# Patient Record
Sex: Male | Born: 1980 | Race: Black or African American | Hispanic: No | Marital: Married | Smoking: Never smoker
Health system: Southern US, Community
[De-identification: ages and names within clinical notes are randomized; demographics above are authoritative.]

## PROBLEM LIST (undated history)

## (undated) HISTORY — PX: WISDOM TOOTH EXTRACTION: SHX21

---

## 2006-07-16 ENCOUNTER — Ambulatory Visit: Payer: Self-pay | Admitting: Internal Medicine

## 2006-07-16 LAB — CONVERTED CEMR LAB
ALT: 38 units/L (ref 0–40)
Alkaline Phosphatase: 79 units/L (ref 39–117)
Chloride: 103 meq/L (ref 96–112)
Glucose, Bld: 91 mg/dL (ref 70–99)
LDL Cholesterol: 92 mg/dL (ref 0–99)
Potassium: 4.1 meq/L (ref 3.5–5.1)
Sodium: 139 meq/L (ref 135–145)
Total Protein: 7.3 g/dL (ref 6.0–8.3)
VLDL: 12 mg/dL (ref 0–40)

## 2006-08-25 ENCOUNTER — Emergency Department (HOSPITAL_COMMUNITY): Admission: EM | Admit: 2006-08-25 | Discharge: 2006-08-25 | Payer: Self-pay | Admitting: Emergency Medicine

## 2007-02-13 ENCOUNTER — Encounter: Payer: Self-pay | Admitting: Internal Medicine

## 2007-09-24 ENCOUNTER — Ambulatory Visit: Payer: Self-pay | Admitting: Internal Medicine

## 2007-09-24 DIAGNOSIS — M6740 Ganglion, unspecified site: Secondary | ICD-10-CM | POA: Insufficient documentation

## 2007-12-17 ENCOUNTER — Ambulatory Visit: Payer: Self-pay | Admitting: Internal Medicine

## 2007-12-17 DIAGNOSIS — L732 Hidradenitis suppurativa: Secondary | ICD-10-CM

## 2008-11-17 ENCOUNTER — Ambulatory Visit: Payer: Self-pay | Admitting: Internal Medicine

## 2008-12-19 ENCOUNTER — Telehealth: Payer: Self-pay | Admitting: Internal Medicine

## 2009-12-05 ENCOUNTER — Telehealth: Payer: Self-pay | Admitting: Internal Medicine

## 2010-03-01 ENCOUNTER — Ambulatory Visit: Payer: Self-pay | Admitting: Internal Medicine

## 2010-03-04 ENCOUNTER — Ambulatory Visit: Payer: Self-pay | Admitting: Internal Medicine

## 2010-03-05 ENCOUNTER — Encounter (INDEPENDENT_AMBULATORY_CARE_PROVIDER_SITE_OTHER): Payer: Self-pay | Admitting: *Deleted

## 2010-03-05 LAB — CONVERTED CEMR LAB
Chloride: 104 meq/L (ref 96–112)
Creatinine, Ser: 1 mg/dL (ref 0.4–1.5)
LDL Cholesterol: 83 mg/dL (ref 0–99)
Total CHOL/HDL Ratio: 2
Triglycerides: 63 mg/dL (ref 0.0–149.0)

## 2010-03-09 ENCOUNTER — Encounter: Payer: Self-pay | Admitting: Internal Medicine

## 2010-03-29 ENCOUNTER — Ambulatory Visit: Payer: Self-pay | Admitting: Internal Medicine

## 2010-03-29 DIAGNOSIS — J020 Streptococcal pharyngitis: Secondary | ICD-10-CM | POA: Insufficient documentation
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
## 2010-05-14 NOTE — Progress Notes (Signed)
      Immunization History:  Influenza Immunization History:    Influenza:  historical (11/22/2009)

## 2010-05-14 NOTE — Letter (Signed)
[facility] [address] [place], [place]  [address] Phone: [phone]      March 10, 2010   Mason Maddox [address] [place], [place] [address]  RE:  LAB RESULTS  Dear  Mason Maddox,  The following is an interpretation of your most recent lab tests.  Please take note of any instructions provided or changes to medications that have resulted from your lab work.  ELECTROLYTES:  Good - no changes needed  KIDNEY FUNCTION TESTS:  Good - no changes needed  Health professionals look at cholesterol as more involved than just the total cholesterol. We consider the level of LDL (bad) cholesterol, HDL (good), cholesterol, and Triglycerides (Grease) in the blood.  1. Your LDL should be under 100, and the HDL should be over 45, if you have any vascular disease such as heart attack, angina, stroke, TIA (mini stroke), claudication (pain in the legs when you walk due to poor circulation),  Abdominal Aortic Aneurysm (AAA), diabetes or prediabetes.  2. Your LDL should be under 130 if you have any two of the following:     a. Smoke or chew tobacco,     b. High blood pressure (if you are on medication or over 140/90 without medication),     c. Male gender,    d. HDL below 40,    e. A male relative (father, brother, or son), who have had any vascular event          as described in #1. above under the age of 28, or a male relative (mother,       sister, or daughter) who had an event as described above under age 56. (An HDL over 60 will subtract one risk factor from the total, so if you have two items in # 2 above, but an HDL over 60, you then fall into category # 3 below).  3. Your LDL should be under 160 if you have any one of the above.  Triglycerides should be under 200 with the ideal being under 150.  For diabetes or pre-diabetes, the ideal HgbA1C should be under 6.0%.  If you fall into any of the above categories, you should make a follow up appointment to  discuss this with your physician.  LIPID PANEL:  Good - no changes needed Triglyceride: 63.0   Cholesterol: 191   LDL: 83   HDL: 95.70   Chol/HDL%:  2  DIABETIC STUDIES:  Good - no changes needed Blood Glucose: 123    Lab results look fine except for the fasting blood sugar being 123 with normal being 70-115.  Please follow a sugar restricted, low carb diet.  Please come see me if you have any questions about these lab results.   Sincerely Yours,    Jacques Navy MD  Jolinda Croak Note: All result statuses are Final unless otherwise noted.  Tests: (1) Lipid Panel (LIPID)   Cholesterol               191 mg/dL                   0-102     ATP III Classification            Desirable:  < 200 mg/dL                    Borderline High:  200 - 239 mg/dL  High:  > = 240 mg/dL   Triglycerides             63.0 mg/dL                  0.9-811.9     Normal:  <150 mg/dL     Borderline High:  147 - 199 mg/dL   HDL                       82.95 mg/dL                 >62.13   VLDL Cholesterol          12.6 mg/dL                  0.8-65.7   LDL Cholesterol           83 mg/dL                    8-46  CHO/HDL Ratio:  CHD Risk                             2                    Men          Women     1/2 Average Risk     3.4          3.3     Average Risk          5.0          4.4     2X Average Risk          9.6          7.1     3X Average Risk          15.0          11.0                           Tests: (2) BMP (METABOL)   Sodium                    140 mEq/L                   135-145   Potassium                 4.0 mEq/L                   3.5-5.1   Chloride                  104 mEq/L                   96-112   Carbon Dioxide            27 mEq/L                    19-32   Glucose              [H]  123 mg/dL                   96-29   BUN                       16 mg/dL  6-23   Creatinine                1.0 mg/dL                   9.8-1.1   Calcium                    9.2 mg/dL                   9.1-47.8   GFR                       114.53 mL/min               >60
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
## 2010-08-30 NOTE — Assessment & Plan Note (Signed)
[facility]                           PRIMARY CARE OFFICE NOTE   AUDIEL, SCHEIBER                       MRN:          [id number]  DATE:07/16/2006                            DOB:          1980/11/22    Mr. Mason Maddox is a 30 year old African-American gentleman who presents to  establish for ongoing continuity of care.  He has no active complaints  at this time.   PAST MEDICAL HISTORY:  Surgeries:  None.  Trauma:  None.  Medical:  The patient had chicken pox.  He is fully immunized.  He has  had no other significant medical illness.   MEDICATIONS:  None.   HABITS:  Tobacco:  The patient uses oral tobacco.  Alcohol:  The patient  averages 6 ounces of alcohol per week.  The patient uses no recreational  drugs.   There are no known drug allergies.   FAMILY HISTORY:  Both parents have hypertension.  Both parents have  hyperlipidemia.   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 in health, PE and kinesiology.  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  The  patient has a significant other with whom he has had a 69-year-old  daughter and has a brand-new 32-day-old daughter, named Lely and  F[place], respectively.  The patient said he is engaged and going to be  married in 2009.   REVIEW OF SYSTEMS:  Negative for any constitutional, cardiovascular,  respiratory, GI, GU or musculoskeletal problems or complaints.   PHYSICAL EXAMINATION:  VITAL SIGNS:  Temperature was 97.8, blood  pressure 125/76, pulse 76, weight 192.  Height is 5 feet 7 inches.  GENERAL APPEARANCE:  This is a well-nourished, athletic-appearing  gentleman in no acute distress.  HEENT:  Normocephalic, atraumatic.  EACs and TMs are unremarkable.  Oropharynx with native dentition in good repair.  No buccal or palatal  lesions were noted.  Posterior pharynx was clear.  Conjunctivae and  sclerae were  clear.  PERRLA, EOMI.  Funduscopic exam revealed normal  disc margins.  No vascular abnormalities were appreciated.  NECK:  Supple without thyromegaly.  NODES:  No adenopathy was noted in the cervical or supraclavicular  regions.  CHEST:  No CVA tenderness.  LUNGS:  Clear to auscultation and percussion.  CARDIOVASCULAR:  2+ radial pulse.  No JVD or carotid bruits.  He had a  quiet precordium with regular rate and rhythm without murmurs, rubs or  gallops.  ABDOMEN:  Soft, no guarding, no rebound.  No organosplenomegaly was  noted.  GENITALIA, RECTAL:  Exams deferred.  EXTREMITIES:  Unremarkable.  SKIN:  The patient has a very black, dark, small 1 mm mole on the outer  portion of his right ear with an irregular border.  NEUROLOGIC:  Nonfocal.   Laboratory ordered and pending includes a lipid panel and a  comprehensive metabolic panel.   ASSESSMENT AND PLAN:  1. Dermatologic:  The patient was advised to watch this mole on his      ear carefully.  He  said it has been there all his life.  He is      advised that if there is any change, he would need to have this      removed.  2. Health maintenance:  I have advised the patient of the [place]      Preventive Health Care Task Force guidelines of having an exam      every 5 years and laboratory every 5 years, which was ordered for      today.  If he remains healthy, I would have him return to see me in      5 years, and he would be an established patient at that point to      return to see me without having to go to a new patient      registration.   In summary, this is a very pleasant gentleman.  He is oriented to our  practice and the services that we offer.  He is asked to return to see  me in 5 years or on a p.r.n. basis.     Rosalyn Gess Norins, MD  Electronically Signed    MEN/MedQ  DD: 07/16/2006  DT: 07/16/2006  Job #: [id number]   cc:   [facility] Erick Alley

## 2011-06-25 ENCOUNTER — Encounter: Payer: Self-pay | Admitting: Internal Medicine

## 2011-06-25 ENCOUNTER — Ambulatory Visit (INDEPENDENT_AMBULATORY_CARE_PROVIDER_SITE_OTHER): Payer: BC Managed Care – PPO | Admitting: Internal Medicine

## 2011-06-25 VITALS — BP 116/70 | HR 69 | Temp 97.0°F | Wt 198.1 lb

## 2011-06-25 DIAGNOSIS — M545 Low back pain, unspecified: Secondary | ICD-10-CM

## 2011-06-25 MED ORDER — MELOXICAM 15 MG PO TABS: 15.0000 mg | ORAL_TABLET | Freq: Every day | ORAL | Status: DC

## 2011-06-25 MED ORDER — CYCLOBENZAPRINE HCL 5 MG PO TABS: 5.0000 mg | ORAL_TABLET | Freq: Three times a day (TID) | ORAL | Status: DC | PRN

## 2011-06-25 NOTE — Patient Instructions (Signed)
Low back pain - exam today is negative for any radicular symptoms, i.e. Herniated disk or pinched nerve.  Plan - meloxicam 15 mg once a day           Flexeril 5 mg three times a day           Rest your back as much as possible           When your back is better - daily back fitness program  10-15 minutes a day.   Low Back Sprain with Rehab   A sprain is an injury in which a ligament is torn. The ligaments of the lower back are vulnerable to sprains. However, they are strong and require great force to be injured. These ligaments are important for stabilizing the spinal column. Sprains are classified into three categories. Grade 1 sprains cause pain, but the tendon is not lengthened. Grade 2 sprains include a lengthened ligament, due to the ligament being stretched or partially ruptured. With grade 2 sprains there is still function, although the function may be decreased. Grade 3 sprains involve a complete tear of the tendon or muscle, and function is usually impaired. SYMPTOMS    Severe pain in the lower back.   Sometimes, a feeling of a "pop," "snap," or tear, at the time of injury.   Tenderness and sometimes swelling at the injury site.   Uncommonly, bruising (contusion) within 48 hours of injury.   Muscle spasms in the back.  CAUSES   Low back sprains occur when a force is placed on the ligaments that is greater than they can handle. Common causes of injury include:  Performing a stressful act while off-balance.   Repetitive stressful activities that involve movement of the lower back.   Direct hit (trauma) to the lower back.  RISK INCREASES WITH:  Contact sports (football, wrestling).   Collisions (major skiing accidents).   Sports that require throwing or lifting (baseball, weightlifting).   Sports involving twisting of the spine (gymnastics, diving, tennis, golf).   Poor strength and flexibility.   Inadequate protection.   Previous back injury or surgery (especially  fusion).  PREVENTION  Wear properly fitted and padded protective equipment.   Warm up and stretch properly before activity.   Allow for adequate recovery between workouts.   Maintain physical fitness:   Strength, flexibility, and endurance.   Cardiovascular fitness.   Maintain a healthy body weight.  PROGNOSIS   If treated properly, low back sprains usually heal with non-surgical treatment. The length of time for healing depends on the severity of the injury.   RELATED COMPLICATIONS    Recurring symptoms, resulting in a chronic problem.   Chronic inflammation and pain in the low back.   Delayed healing or resolution of symptoms, especially if activity is resumed too soon.   Prolonged impairment.   Unstable or arthritic joints of the low back.  TREATMENT   Treatment first involves the use of ice and medicine, to reduce pain and inflammation. The use of strengthening and stretching exercises may help reduce pain with activity. These exercises may be performed at home or with a therapist. Severe injuries may require referral to a therapist for further evaluation and treatment, such as ultrasound. Your caregiver may advise that you wear a back brace or corset, to help reduce pain and discomfort. Often, prolonged bed rest results in greater harm then benefit. Corticosteroid injections may be recommended. However, these should be reserved for the most serious cases. It is important  to avoid using your back when lifting objects. At night, sleep on your back on a firm mattress, with a pillow placed under your knees. If non-surgical treatment is unsuccessful, surgery may be needed.   MEDICATION    If pain medicine is needed, nonsteroidal anti-inflammatory medicines (aspirin and ibuprofen), or other minor pain relievers (acetaminophen), are often advised.   Do not take pain medicine for 7 days before surgery.   Prescription pain relievers may be given, if your caregiver thinks they are  needed. Use only as directed and only as much as you need.   Ointments applied to the skin may be helpful.   Corticosteroid injections may be given by your caregiver. These injections should be reserved for the most serious cases, because they may only be given a certain number of times.  HEAT AND COLD  Cold treatment (icing) should be applied for 10 to 15 minutes every 2 to 3 hours for inflammation and pain, and immediately after activity that aggravates your symptoms. Use ice packs or an ice massage.   Heat treatment may be used before performing stretching and strengthening activities prescribed by your caregiver, physical therapist, or athletic trainer. Use a heat pack or a warm water soak.  SEEK MEDICAL CARE IF:    Symptoms get worse or do not improve in 2 to 4 weeks, despite treatment.   You develop numbness or weakness in either leg.   You lose bowel or bladder function.   Any of the following occur after surgery: fever, increased pain, swelling, redness, drainage of fluids, or bleeding in the affected area.   New, unexplained symptoms develop. (Drugs used in treatment may produce side effects.)  EXERCISES   RANGE OF MOTION (ROM) AND STRETCHING EXERCISES - Low Back Sprain Most people with lower back pain will find that their symptoms get worse with excessive bending forward (flexion) or arching at the lower back (extension). The exercises that will help resolve your symptoms will focus on the opposite motion.   Your physician, physical therapist or athletic trainer will help you determine which exercises will be most helpful to resolve your lower back pain. Do not complete any exercises without first consulting with your caregiver. Discontinue any exercises which make your symptoms worse, until you speak to your caregiver. If you have pain, numbness or tingling which travels down into your buttocks, leg or foot, the goal of the therapy is for these symptoms to move closer to your back  and eventually resolve. Sometimes, these leg symptoms will get better, but your lower back pain may worsen. This is often an indication of progress in your rehabilitation. Be very alert to any changes in your symptoms and the activities in which you participated in the 24 hours prior to the change. Sharing this information with your caregiver will allow him or her to most efficiently treat your condition. These exercises may help you when beginning to rehabilitate your injury. Your symptoms may resolve with or without further involvement from your physician, physical therapist or athletic trainer. While completing these exercises, remember:    Restoring tissue flexibility helps normal motion to return to the joints. This allows healthier, less painful movement and activity.   An effective stretch should be held for at least 30 seconds.   A stretch should never be painful. You should only feel a gentle lengthening or release in the stretched tissue.  FLEXION RANGE OF MOTION AND STRETCHING EXERCISES: STRETCH - Flexion, Single Knee to Chest   Lie on  a firm bed or floor with both legs extended in front of you.   Keeping one leg in contact with the floor, bring your opposite knee to your chest. Hold your leg in place by either grabbing behind your thigh or at your knee.   Pull until you feel a gentle stretch in your low back. Hold __________ seconds.   Slowly release your grasp and repeat the exercise with the opposite side.  Repeat __________ times. Complete this exercise __________ times per day.   STRETCH - Flexion, Double Knee to Chest  Lie on a firm bed or floor with both legs extended in front of you.   Keeping one leg in contact with the floor, bring your opposite knee to your chest.   Tense your stomach muscles to support your back and then lift your other knee to your chest. Hold your legs in place by either grabbing behind your thighs or at your knees.   Pull both knees toward your  chest until you feel a gentle stretch in your low back. Hold __________ seconds.   Tense your stomach muscles and slowly return one leg at a time to the floor.  Repeat __________ times. Complete this exercise __________ times per day.   STRETCH - Low Trunk Rotation  Lie on a firm bed or floor. Keeping your legs in front of you, bend your knees so they are both pointed toward the ceiling and your feet are flat on the floor.   Extend your arms out to the side. This will stabilize your upper body by keeping your shoulders in contact with the floor.   Gently and slowly drop both knees together to one side until you feel a gentle stretch in your low back. Hold for __________ seconds.   Tense your stomach muscles to support your lower back as you bring your knees back to the starting position. Repeat the exercise to the other side.  Repeat __________ times. Complete this exercise __________ times per day   EXTENSION RANGE OF MOTION AND FLEXIBILITY EXERCISES: STRETCH - Extension, Prone on Elbows   Lie on your stomach on the floor, a bed will be too soft. Place your palms about shoulder width apart and at the height of your head.   Place your elbows under your shoulders. If this is too painful, stack pillows under your chest.   Allow your body to relax so that your hips drop lower and make contact more completely with the floor.   Hold this position for __________ seconds.   Slowly return to lying flat on the floor.  Repeat __________ times. Complete this exercise __________ times per day.   RANGE OF MOTION - Extension, Prone Press Ups  Lie on your stomach on the floor, a bed will be too soft. Place your palms about shoulder width apart and at the height of your head.   Keeping your back as relaxed as possible, slowly straighten your elbows while keeping your hips on the floor. You may adjust the placement of your hands to maximize your comfort. As you gain motion, your hands will come more  underneath your shoulders.   Hold this position __________ seconds.   Slowly return to lying flat on the floor.  Repeat __________ times. Complete this exercise __________ times per day.   RANGE OF MOTION- Quadruped, Neutral Spine   Assume a hands and knees position on a firm surface. Keep your hands under your shoulders and your knees under your hips. You may place  padding under your knees for comfort.   Drop your head and point your tailbone toward the ground below you. This will round out your lower back like an angry cat. Hold this position for __________ seconds.   Slowly lift your head and release your tail bone so that your back sags into a large arch, like an old horse.   Hold this position for __________ seconds.   Repeat this until you feel limber in your low back.   Now, find your "sweet spot." This will be the most comfortable position somewhere between the two previous positions. This is your neutral spine. Once you have found this position, tense your stomach muscles to support your low back.   Hold this position for __________ seconds.  Repeat __________ times. Complete this exercise __________ times per day.   STRENGTHENING EXERCISES - Low Back Sprain These exercises may help you when beginning to rehabilitate your injury. These exercises should be done near your "sweet spot." This is the neutral, low-back arch, somewhere between fully rounded and fully arched, that is your least painful position. When performed in this safe range of motion, these exercises can be used for people who have either a flexion or extension based injury. These exercises may resolve your symptoms with or without further involvement from your physician, physical therapist or athletic trainer. While completing these exercises, remember:    Muscles can gain both the endurance and the strength needed for everyday activities through controlled exercises.   Complete these exercises as instructed by your  physician, physical therapist or athletic trainer. Increase the resistance and repetitions only as guided.   You may experience muscle soreness or fatigue, but the pain or discomfort you are trying to eliminate should never worsen during these exercises. If this pain does worsen, stop and make certain you are following the directions exactly. If the pain is still present after adjustments, discontinue the exercise until you can discuss the trouble with your caregiver.  STRENGTHENING - Deep Abdominals, Pelvic Tilt   Lie on a firm bed or floor. Keeping your legs in front of you, bend your knees so they are both pointed toward the ceiling and your feet are flat on the floor.   Tense your lower abdominal muscles to press your low back into the floor. This motion will rotate your pelvis so that your tail bone is scooping upwards rather than pointing at your feet or into the floor.  With a gentle tension and even breathing, hold this position for __________ seconds. Repeat __________ times. Complete this exercise __________ times per day.   STRENGTHENING - Abdominals, Crunches   Lie on a firm bed or floor. Keeping your legs in front of you, bend your knees so they are both pointed toward the ceiling and your feet are flat on the floor. Cross your arms over your chest.   Slightly tip your chin down without bending your neck.   Tense your abdominals and slowly lift your trunk high enough to just clear your shoulder blades. Lifting higher can put excessive stress on the lower back and does not further strengthen your abdominal muscles.   Control your return to the starting position.  Repeat __________ times. Complete this exercise __________ times per day.   STRENGTHENING - Quadruped, Opposite UE/LE Lift   Assume a hands and knees position on a firm surface. Keep your hands under your shoulders and your knees under your hips. You may place padding under your knees for comfort.  Find your neutral  spine and gently tense your abdominal muscles so that you can maintain this position. Your shoulders and hips should form a rectangle that is parallel with the floor and is not twisted.   Keeping your trunk steady, lift your right hand no higher than your shoulder and then your left leg no higher than your hip. Make sure you are not holding your breath. Hold this position for __________ seconds.   Continuing to keep your abdominal muscles tense and your back steady, slowly return to your starting position. Repeat with the opposite arm and leg.  Repeat __________ times. Complete this exercise __________ times per day.   STRENGTHENING - Abdominals and Quadriceps, Straight Leg Raise   Lie on a firm bed or floor with both legs extended in front of you.   Keeping one leg in contact with the floor, bend the other knee so that your foot can rest flat on the floor.   Find your neutral spine, and tense your abdominal muscles to maintain your spinal position throughout the exercise.   Slowly lift your straight leg off the floor about 6 inches for a count of 15, making sure to not hold your breath.   Still keeping your neutral spine, slowly lower your leg all the way to the floor.  Repeat this exercise with each leg __________ times. Complete this exercise __________ times per day. POSTURE AND BODY MECHANICS CONSIDERATIONS - Low Back Sprain Keeping correct posture when sitting, standing or completing your activities will reduce the stress put on different body tissues, allowing injured tissues a chance to heal and limiting painful experiences. The following are general guidelines for improved posture. Your physician or physical therapist will provide you with any instructions specific to your needs. While reading these guidelines, remember:  The exercises prescribed by your provider will help you have the flexibility and strength to maintain correct postures.   The correct posture provides the best  environment for your joints to work. All of your joints have less wear and tear when properly supported by a spine with good posture. This means you will experience a healthier, less painful body.   Correct posture must be practiced with all of your activities, especially prolonged sitting and standing. Correct posture is as important when doing repetitive low-stress activities (typing) as it is when doing a single heavy-load activity (lifting).  RESTING POSITIONS Consider which positions are most painful for you when choosing a resting position. If you have pain with flexion-based activities (sitting, bending, stooping, squatting), choose a position that allows you to rest in a less flexed posture. You would want to avoid curling into a fetal position on your side. If your pain worsens with extension-based activities (prolonged standing, working overhead), avoid resting in an extended position such as sleeping on your stomach. Most people will find more comfort when they rest with their spine in a more neutral position, neither too rounded nor too arched. Lying on a non-sagging bed on your side with a pillow between your knees, or on your back with a pillow under your knees will often provide some relief. Keep in mind, being in any one position for a prolonged period of time, no matter how correct your posture, can still lead to stiffness. PROPER SITTING POSTURE In order to minimize stress and discomfort on your spine, you must sit with correct posture. Sitting with good posture should be effortless for a healthy body. Returning to good posture is a gradual process. Many people  can work toward this most comfortably by using various supports until they have the flexibility and strength to maintain this posture on their own. When sitting with proper posture, your ears will fall over your shoulders and your shoulders will fall over your hips. You should use the back of the chair to support your upper back. Your  lower back will be in a neutral position, just slightly arched. You may place a small pillow or folded towel at the base of your lower back for   support.   When working at a desk, create an environment that supports good, upright posture. Without extra support, muscles tire, which leads to excessive strain on joints and other tissues. Keep these recommendations in mind: CHAIR:  A chair should be able to slide under your desk when your back makes contact with the back of the chair. This allows you to work closely.   The chair's height should allow your eyes to be level with the upper part of your monitor and your hands to be slightly lower than your elbows.  BODY POSITION  Your feet should make contact with the floor. If this is not possible, use a foot rest.   Keep your ears over your shoulders. This will reduce stress on your neck and low back.  INCORRECT SITTING POSTURES  If you are feeling tired and unable to assume a healthy sitting posture, do not slouch or slump. This puts excessive strain on your back tissues, causing more damage and pain. Healthier options include:  Using more support, like a lumbar pillow.   Switching tasks to something that requires you to be upright or walking.   Talking a brief walk.   Lying down to rest in a neutral-spine position.  PROLONGED STANDING WHILE SLIGHTLY LEANING FORWARD  When completing a task that requires you to lean forward while standing in one place for a long time, place either foot up on a stationary 2-4 inch high object to help maintain the best posture. When both feet are on the ground, the lower back tends to lose its slight inward curve. If this curve flattens (or becomes too large), then the back and your other joints will experience too much stress, tire more quickly, and can cause pain. CORRECT STANDING POSTURES Proper standing posture should be assumed with all daily activities, even if they only take a few moments, like when  brushing your teeth. As in sitting, your ears should fall over your shoulders and your shoulders should fall over your hips. You should keep a slight tension in your abdominal muscles to brace your spine. Your tailbone should point down to the ground, not behind your body, resulting in an over-extended swayback posture.   INCORRECT STANDING POSTURES  Common incorrect standing postures include a forward head, locked knees and/or an excessive swayback. WALKING Walk with an upright posture. Your ears, shoulders and hips should all line-up. PROLONGED ACTIVITY IN A FLEXED POSITION When completing a task that requires you to bend forward at your waist or lean over a low surface, try to find a way to stabilize 3 out of 4 of your limbs. You can place a hand or elbow on your thigh or rest a knee on the surface you are reaching across. This will provide you more stability, so that your muscles do not tire as quickly. By keeping your knees relaxed, or slightly bent, you will also reduce stress across your lower back. CORRECT LIFTING TECHNIQUES DO :  Assume a wide stance.  This will provide you more stability and the opportunity to get as close as possible to the object which you are lifting.   Tense your abdominals to brace your spine. Bend at the knees and hips. Keeping your back locked in a neutral-spine position, lift using your leg muscles. Lift with your legs, keeping your back straight.   Test the weight of unknown objects before attempting to lift them.   Try to keep your elbows locked down at your sides in order get the best strength from your shoulders when carrying an object.   Always ask for help when lifting heavy or awkward objects.  INCORRECT LIFTING TECHNIQUES DO NOT:   Lock your knees when lifting, even if it is a small object.   Bend and twist. Pivot at your feet or move your feet when needing to change directions.   Assume that you can safely pick up even a paperclip without proper  posture.  Document Released: 03/31/2005 Document Revised: 03/20/2011 Document Reviewed: 07/13/2008 [facility] Patient Information 2012 [place], [place].

## 2011-06-29 ENCOUNTER — Encounter: Payer: Self-pay | Admitting: Internal Medicine

## 2011-06-29 DIAGNOSIS — M545 Low back pain: Secondary | ICD-10-CM | POA: Insufficient documentation

## 2011-06-29 NOTE — Progress Notes (Signed)
  Subjective:    Patient ID: Mason Maddox, male    DOB: [birth year], 31 y.o.   MRN: [id number]  HPI Mason Maddox presents with a several day h/o low back pain. He denies any paresthesia, incontinence of bowel or bladder, motor weakness. He does have pain with playing golf. He has had back pain in the past.     History   Social History  . Marital Status: Married    Spouse Name: N/A    Number of Children: 2  . Years of Education: 16   Occupational History  . retail Drugstore mgr     [employer]   Social History Main Topics  . Smoking status: Never Smoker   . Smokeless tobacco: Never Used  . Alcohol Use: No  . Drug Use: No  . Sexually Active: Yes -- Male partner(s)   Other Topics Concern  . Not on file   Social History Narrative   [employer]- BS. Married- '09. 2 dtrs -'06, '09. 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     Objective:   Physical Exam Filed Vitals:   06/25/11 1638  BP: 116/70  Pulse: 69  Temp: 97 F (36.1 C)   Wt Readings from Last 3 Encounters:  06/25/11 198 lb 2 oz (89.869 kg)  03/29/10 198 lb (89.812 kg)  03/01/10 197 lb (89.359 kg)   Gen'l- WNWD athletic appearing AA man in no distress Cor- RRR PUllm - normal respirations. Back exam: normal stand; normal flex to greater than 100 degrees; normal gait; normal toe/heel walk; normal step up to exam table; normal SLR sitting; normal DTRs at the patellar tendons; normal sensation to light touch, pin-prick and deep vibratory stimulus; no  CVA tenderness; able to move supine to sitting witout assistance.        Assessment & Plan:

## 2011-06-29 NOTE — Assessment & Plan Note (Signed)
Low back pain c/w strain. No radicular findings on exam.  Plan - heat,, NSAIDs, back rest           When acute phase passed advised to do daily low back exercise: pamphlet provided and referred to [email or web address]

## 2011-08-13 ENCOUNTER — Other Ambulatory Visit: Payer: Self-pay

## 2011-08-13 MED ORDER — CYCLOBENZAPRINE HCL 5 MG PO TABS: 5.0000 mg | ORAL_TABLET | Freq: Three times a day (TID) | ORAL | Status: DC | PRN

## 2011-08-14 ENCOUNTER — Other Ambulatory Visit: Payer: Self-pay | Admitting: *Deleted

## 2011-08-14 MED ORDER — CYCLOBENZAPRINE HCL 5 MG PO TABS: 5.0000 mg | ORAL_TABLET | Freq: Three times a day (TID) | ORAL | Status: AC | PRN

## 2011-08-19 ENCOUNTER — Other Ambulatory Visit: Payer: Self-pay | Admitting: *Deleted

## 2011-08-19 MED ORDER — MELOXICAM 15 MG PO TABS: 15.0000 mg | ORAL_TABLET | Freq: Every day | ORAL | Status: AC

## 2011-08-26 ENCOUNTER — Other Ambulatory Visit: Payer: Self-pay

## 2011-08-26 MED ORDER — CYCLOBENZAPRINE HCL 5 MG PO TABS: 5.0000 mg | ORAL_TABLET | Freq: Three times a day (TID) | ORAL | Status: AC | PRN

## 2012-01-23 ENCOUNTER — Other Ambulatory Visit (INDEPENDENT_AMBULATORY_CARE_PROVIDER_SITE_OTHER): Payer: BC Managed Care – PPO

## 2012-01-23 ENCOUNTER — Ambulatory Visit (INDEPENDENT_AMBULATORY_CARE_PROVIDER_SITE_OTHER): Payer: BC Managed Care – PPO | Admitting: Internal Medicine

## 2012-01-23 ENCOUNTER — Encounter: Payer: Self-pay | Admitting: Internal Medicine

## 2012-01-23 VITALS — BP 140/70 | HR 104 | Temp 98.5°F | Resp 16 | Wt 191.2 lb

## 2012-01-23 DIAGNOSIS — Z202 Contact with and (suspected) exposure to infections with a predominantly sexual mode of transmission: Secondary | ICD-10-CM | POA: Insufficient documentation

## 2012-01-23 DIAGNOSIS — Z2089 Contact with and (suspected) exposure to other communicable diseases: Secondary | ICD-10-CM

## 2012-01-23 LAB — URINALYSIS, ROUTINE W REFLEX MICROSCOPIC
Hgb urine dipstick: NEGATIVE
Leukocytes, UA: NEGATIVE
Nitrite: NEGATIVE
Total Protein, Urine: NEGATIVE
Urobilinogen, UA: 1 (ref 0.0–1.0)

## 2012-01-23 MED ORDER — STERILE WATER FOR INJECTION IJ SOLN
250.0000 mg | Freq: Once | INTRAMUSCULAR | Status: AC
  Administered 2012-01-23: 250 mg via INTRAMUSCULAR

## 2012-01-23 NOTE — Patient Instructions (Signed)
Safer Sex  Your caregiver wants you to have this information about the infections that can be transmitted from sexual contact and how to prevent them. The idea behind safer sex is that you can be sexually active, and at the same time reduce the risk of giving or getting a sexually transmitted disease (STD). Every person should be aware of how to prevent him or herself and his or her sex partner from getting an STD.  CAUSES OF STDS  STDs are transmitted by sharing body fluids, which contain viruses and bacteria. The following fluids all transmit infections during sexual intercourse and sex acts:  · Semen.  · Saliva.  · Urine.  · Blood.  · Vaginal mucus.  Examples of STDs include:  · Chlamydia.  · Gonorrhea.  · Genital herpes.  · Hepatitis B.  · Human immunodeficiency virus or acquired immunodeficiency syndrome (HIV or AIDS).  · Syphilis.  · Trichomonas.  · Pubic lice.  · Human papillomavirus (HPV), which may include:  · Genital warts.  · Cervical dysplasia.  · Cervical cancer (can develop with certain types of HPV).  SYMPTOMS   Sexual diseases often cause few or no symptoms until they are advanced, so a person can be infected and spread the infection without knowing it. Some STDs respond to treatment very well. Others, like HIV and herpes, cannot be cured, but are treated to reduce their effects.  Specific symptoms include:  · Abnormal vaginal discharge.  · Irritation or itching in and around the vagina, and in the pubic hair.  · Pain during sexual intercourse.  · Bleeding during sexual intercourse.  · Pelvic or abdominal pain.  · Fever.  · Growths in and around the vagina.  · An ulcer in or around the vagina.  · Swollen glands in the groin area.  DIAGNOSIS   · Blood tests.  · Pap test.  · Culture test of abnormal vaginal discharge.  · A test that applies a solution and examines the cervix with a lighted magnifying scope (colposcopy).  · A test that examines the pelvis with a lighted tube, through a small incision  (laparoscopy).  TREATMENT   The treatment will depend on the cause of the STD.  · Antibiotic treatment by injection, oral, creams, or suppositories in the vagina.  · Over-the-counter medicated shampoo, to get rid of pubic lice.  · Removing or treating growths with medicine, freezing, burning (electrocautery), or surgery.  · Surgery treatment for HPV of the cervix.  · Supportive medicines for herpes, HIV, AIDS, and hepatitis.  Being careful cannot eliminate all risk of infection, but sex can be made much safer.  Safe sexual practices include body massage and gentle touching. Masturbation is safe, as long as body fluids do not contact skin that has sores or cuts. Dry kissing and oral sex on a man wearing a latex condom or on a woman wearing a male condom is also safe. Slightly less safe is intercourse while the man wears a latex condom or wet kissing. It is also safer to have one sex partner that you know is not having sex with anyone else.  LENGTH OF ILLNESS  An STD might be treated and cured in a week, sometimes a month, or more. And it can linger with symptoms for many years. STDs can also cause damage to the male organs. This can cause chronic pain, infertility, and recurrence of the STD, especially herpes, hepatitis, HIV, and HPV.  HOME CARE INSTRUCTIONS AND PREVENTION  · Alcohol   and recreational drugs are often the reason given for not practicing safer sex. These substances affect your judgment. Alcohol and recreational drugs can also impair your immune system, making you more vulnerable to disease.  · Do not engage in risky and dangerous sexual practices, including:  · Vaginal or anal sex without a condom.  · Oral sex on a man without a condom.  · Oral sex on a woman without a male condom.  · Using saliva to lubricate a condom.  · Any other sexual contact in which body fluids or blood from one partner contact the other partner.  · You should use only latex condoms for men and water soluble lubricants.  Petroleum based lubricants or oils used to lubricate a condom will weaken the condom and increase the chance that it will break.  · Think very carefully before having sex with anyone who is high risk for STDs and HIV. This includes IV drug users, people with multiple sexual partners, or people who have had an STD, or a positive hepatitis or HIV blood test.  · Remember that even if your partner has had only one previous partner, their previous partner might have had multiple partners. If so, you are at high risk of being exposed to an STD. You and your sex partner should be the only sex partners with each other, with no one else involved.  · A vaccine is available for hepatitis B and HPV through your caregiver or the Public Health Department. Everyone should be vaccinated with these vaccines.  · Avoid risky sex practices. Sex acts that can break the skin make you more likely to get an STD.  SEEK MEDICAL CARE IF:   · If you think you have an STD, even if you do not have any symptoms. Contact your caregiver for evaluation and treatment, if needed.  · You think or know your sex partner has acquired an STD.  · You have any of the symptoms mentioned above.  Document Released: 05/08/2004 Document Revised: 06/23/2011 Document Reviewed: 02/28/2009  [employer]® Patient Information ©2013 [employer].

## 2012-01-23 NOTE — Progress Notes (Signed)
  Subjective:    Patient ID: Mason Maddox, male    DOB: 1980/12/27, 31 y.o.   MRN: [id number]  HPI  New to me he tells me that his wife was diagnosed with GC yesterday and he wants to be treated and tested, he has no symptoms.  Review of Systems  Constitutional: Negative for fever, chills, diaphoresis, activity change, appetite change, fatigue and unexpected weight change.  HENT: Negative.   Eyes: Negative.   Respiratory: Negative.   Cardiovascular: Negative.   Gastrointestinal: Negative.   Genitourinary: Negative for dysuria, frequency, discharge, penile swelling, scrotal swelling, difficulty urinating, genital sores and penile pain.  Musculoskeletal: Negative.   Skin: Negative for color change, pallor, rash and wound.  Neurological: Negative.   Hematological: Negative for adenopathy. Does not bruise/bleed easily.  Psychiatric/Behavioral: Negative.        Objective:   Physical Exam  Vitals reviewed. Constitutional: He is oriented to person, place, and time. He appears well-developed and well-nourished. No distress.  HENT:  Head: Normocephalic and atraumatic.  Mouth/Throat: Oropharynx is clear and moist. No oropharyngeal exudate.  Eyes: Conjunctivae normal are normal. Right eye exhibits no discharge. Left eye exhibits no discharge. No scleral icterus.  Neck: Normal range of motion. Neck supple. No JVD present. No tracheal deviation present. No thyromegaly present.  Cardiovascular: Normal rate, regular rhythm, normal heart sounds and intact distal pulses.  Exam reveals no gallop and no friction rub.   No murmur heard. Pulmonary/Chest: Effort normal and breath sounds normal. No stridor. No respiratory distress. He has no wheezes. He has no rales. He exhibits no tenderness.  Abdominal: Soft. Bowel sounds are normal. He exhibits no distension and no mass. There is no tenderness. There is no rebound and no guarding. Hernia confirmed negative in the right inguinal area and confirmed  negative in the left inguinal area.  Genitourinary: Testes normal and penis normal. Right testis shows no mass, no swelling and no tenderness. Right testis is descended. Left testis shows no mass, no swelling and no tenderness. Left testis is descended. Circumcised. No penile erythema or penile tenderness. No discharge found.  Musculoskeletal: Normal range of motion. He exhibits no edema.  Lymphadenopathy:    He has no cervical adenopathy.    He has no axillary adenopathy.       Right: No inguinal adenopathy present.       Left: No inguinal adenopathy present.  Neurological: He is oriented to person, place, and time.  Skin: Skin is warm and dry. No rash noted. He is not diaphoretic. No erythema. No pallor.  Psychiatric: He has a normal mood and affect. His behavior is normal. Judgment and thought content normal.          Assessment & Plan:

## 2012-01-23 NOTE — Assessment & Plan Note (Signed)
Rocephin IM and test his urine for GC/chlamydia

## 2012-01-25 ENCOUNTER — Encounter: Payer: Self-pay | Admitting: Internal Medicine

## 2012-05-05 ENCOUNTER — Ambulatory Visit (INDEPENDENT_AMBULATORY_CARE_PROVIDER_SITE_OTHER): Payer: BC Managed Care – PPO | Admitting: Internal Medicine

## 2012-05-05 ENCOUNTER — Encounter: Payer: Self-pay | Admitting: Internal Medicine

## 2012-05-05 VITALS — BP 140/88 | HR 88 | Temp 99.6°F | Resp 10 | Wt 183.0 lb

## 2012-05-05 DIAGNOSIS — F411 Generalized anxiety disorder: Secondary | ICD-10-CM

## 2012-05-05 DIAGNOSIS — F418 Other specified anxiety disorders: Secondary | ICD-10-CM

## 2012-05-05 DIAGNOSIS — R251 Tremor, unspecified: Secondary | ICD-10-CM

## 2012-05-05 DIAGNOSIS — R259 Unspecified abnormal involuntary movements: Secondary | ICD-10-CM

## 2012-05-05 MED ORDER — SERTRALINE HCL 50 MG PO TABS: 50.0000 mg | ORAL_TABLET | Freq: Every day | ORAL | Status: DC

## 2012-05-05 NOTE — Patient Instructions (Addendum)
1. Situational anxiety - a short term problem, i.e. 3-4 months while things settle down. Plan Sertraline 50 mg once a day at the best time for you. It can cause somnolence but usually not.  2. Tremor - the appearance of the tremor plus having an otherwise negative exam is consistent with the diagnosis of "essential tremor." Plan  Sertraline may actually help with this.  If the tremor interferes with work the next step is to start a low dose of a beta blocker.  Tremor Tremor is a rhythmic, involuntary muscular contraction characterized by oscillations (to-and-fro movements) of a part of the body. The most common of all involuntary movements, tremor can affect various body parts such as the hands, head, facial structures, vocal cords, trunk, and legs; most tremors, however, occur in the hands. Tremor often accompanies neurological disorders associated with aging. Although the disorder is not life-threatening, it can be responsible for functional disability and social embarrassment. TREATMENT   There are many types of tremor and several ways in which tremor is classified. The most common classification is by behavioral context or position. There are five categories of tremor within this classification: resting, postural, kinetic, task-specific, and psychogenic. Resting or static tremor occurs when the muscle is at rest, for example when the hands are lying on the lap. This type of tremor is often seen in patients with Parkinson's disease. Postural tremor occurs when a patient attempts to maintain posture, such as holding the hands outstretched. Postural tremors include physiological tremor, essential tremor, tremor with basal ganglia disease (also seen in patients with Parkinson's disease), cerebellar postural tremor, tremor with peripheral neuropathy, post-traumatic tremor, and alcoholic tremor. Kinetic or intention (action) tremor occurs during purposeful movement, for example during finger-to-nose testing.  Task-specific tremor appears when performing goal-oriented tasks such as handwriting, speaking, or standing. This group consists of primary writing tremor, vocal tremor, and orthostatic tremor. Psychogenic tremor occurs in both older and younger patients. The key feature of this tremor is that it dramatically lessens or disappears when the patient is distracted. PROGNOSIS There are some treatment options available for tremor; the appropriate treatment depends on accurate diagnosis of the cause. Some tremors respond to treatment of the underlying condition, for example in some cases of psychogenic tremor treating the patient's underlying mental problem may cause the tremor to disappear. Also, patients with tremor due to Parkinson's disease may be treated with Levodopa drug therapy. Symptomatic drug therapy is available for several other tremors as well. For those cases of tremor in which there is no effective drug treatment, physical measures such as teaching the patient to brace the affected limb during the tremor are sometimes useful. Surgical intervention such as thalamotomy or deep brain stimulation may be useful in certain cases. Document Released: 03/21/2002 Document Revised: 06/23/2011 Document Reviewed: 03/31/2005 [facility] Patient Information 2013 [place], [place].

## 2012-05-08 DIAGNOSIS — F418 Other specified anxiety disorders: Secondary | ICD-10-CM | POA: Insufficient documentation

## 2012-05-08 NOTE — Progress Notes (Signed)
  Subjective:    Patient ID: Mason Maddox, male    DOB: November 08, 1980, 32 y.o.   MRN: [id number]  HPI Mr. Harmening presents for evaluation of anxiety and stress. His wife, who is pregnant, tested positive for GC at her gyn's office. Mr. Kilgallon came to see Dr. Yetta Barre and he tested negative! This has lead to separation and possible divorce. These circumstances  Have cause him great upset and anxiety so that he is not resting well, it is hard to focus at work and he cannot stop worrying about this situation. He has no prior history of psychiatric issues.  PMH, FamHx and SocHx reviewed for any changes and relevance. Current Outpatient Prescriptions on File Prior to Visit  Medication Sig Dispense Refill  . meloxicam (MOBIC) 15 MG tablet Take 1 tablet (15 mg total) by mouth daily.  30 tablet  1  . sertraline (ZOLOFT) 50 MG tablet Take 1 tablet (50 mg total) by mouth daily.  30 tablet  3      Review of Systems System review is negative for any constitutional, cardiac, pulmonary, GI or neuro symptoms or complaints other than as described in the HPI.     Objective:   Physical Exam Filed Vitals:   05/05/12 1647  BP: 140/88  Pulse: 88  Temp: 99.6 F (37.6 C)  Resp: 10   Gen'l- WNWD athletic appearing AA man in no acute distress Cor- RRR Pulm - normal respirations Psych - normally conversant, affect is not depressed. Neuro - A&O x 3; CN II-XII normal; Cerebellar - very fine, high frequency tremor both hands, no cogwheeling, normal gait and station       Assessment & Plan:  Tremor - patient reports this predated his anxiety issues but has been worse lately. Suspect this is a mild essential tremor  Plan -  Treat anxiety  If the tremor persists and interferes with his activities will consider low dose BB

## 2012-05-08 NOTE — Assessment & Plan Note (Signed)
Marital discord and proceedings Jan '14. a short term problem, i.e. 3-4 months while things settle down. Plan Sertraline 50 mg once a day at the best time for you. It can cause somnolence but usually not.

## 2013-08-01 ENCOUNTER — Encounter: Payer: Self-pay | Admitting: Physician Assistant

## 2013-08-10 ENCOUNTER — Ambulatory Visit: Payer: BC Managed Care – PPO | Admitting: Physician Assistant

## 2013-09-06 ENCOUNTER — Encounter: Payer: Self-pay | Admitting: Internal Medicine

## 2013-09-06 ENCOUNTER — Other Ambulatory Visit (INDEPENDENT_AMBULATORY_CARE_PROVIDER_SITE_OTHER): Payer: BC Managed Care – PPO

## 2013-09-06 ENCOUNTER — Ambulatory Visit (INDEPENDENT_AMBULATORY_CARE_PROVIDER_SITE_OTHER): Payer: BC Managed Care – PPO | Admitting: Internal Medicine

## 2013-09-06 VITALS — BP 118/68 | HR 88 | Temp 98.7°F | Resp 16 | Ht 68.0 in | Wt 181.5 lb

## 2013-09-06 DIAGNOSIS — Z0000 Encounter for general adult medical examination without abnormal findings: Secondary | ICD-10-CM | POA: Insufficient documentation

## 2013-09-06 DIAGNOSIS — R7309 Other abnormal glucose: Secondary | ICD-10-CM

## 2013-09-06 DIAGNOSIS — Z23 Encounter for immunization: Secondary | ICD-10-CM

## 2013-09-06 LAB — CBC WITH DIFFERENTIAL/PLATELET
BASOS ABS: 0 10*3/uL (ref 0.0–0.1)
Basophils Relative: 0.5 % (ref 0.0–3.0)
EOS ABS: 0.4 10*3/uL (ref 0.0–0.7)
Eosinophils Relative: 8.8 % — ABNORMAL HIGH (ref 0.0–5.0)
HCT: 45.6 % (ref 39.0–52.0)
Hemoglobin: 15.4 g/dL (ref 13.0–17.0)
LYMPHS PCT: 27.4 % (ref 12.0–46.0)
Lymphs Abs: 1.2 10*3/uL (ref 0.7–4.0)
MCHC: 33.7 g/dL (ref 30.0–36.0)
MCV: 89.9 fl (ref 78.0–100.0)
Monocytes Absolute: 0.5 10*3/uL (ref 0.1–1.0)
Monocytes Relative: 10.3 % (ref 3.0–12.0)
NEUTROS ABS: 2.3 10*3/uL (ref 1.4–7.7)
NEUTROS PCT: 53 % (ref 43.0–77.0)
PLATELETS: 137 10*3/uL — AB (ref 150.0–400.0)
RBC: 5.08 Mil/uL (ref 4.22–5.81)
RDW: 13.7 % (ref 11.5–15.5)
WBC: 4.4 10*3/uL (ref 4.0–10.5)

## 2013-09-06 LAB — COMPREHENSIVE METABOLIC PANEL
ALBUMIN: 4.4 g/dL (ref 3.5–5.2)
ALT: 39 U/L (ref 0–53)
AST: 34 U/L (ref 0–37)
Alkaline Phosphatase: 68 U/L (ref 39–117)
BUN: 14 mg/dL (ref 6–23)
CALCIUM: 9.5 mg/dL (ref 8.4–10.5)
CHLORIDE: 103 meq/L (ref 96–112)
CO2: 29 meq/L (ref 19–32)
Creatinine, Ser: 1 mg/dL (ref 0.4–1.5)
GFR: 109.39 mL/min (ref >60.00)
Glucose, Bld: 86 mg/dL (ref 70–99)
POTASSIUM: 4.4 meq/L (ref 3.5–5.1)
Sodium: 140 mEq/L (ref 135–145)
TOTAL PROTEIN: 7.6 g/dL (ref 6.0–8.3)
Total Bilirubin: 0.6 mg/dL (ref 0.2–1.2)

## 2013-09-06 LAB — LIPID PANEL
CHOL/HDL RATIO: 2
Cholesterol: 234 mg/dL — ABNORMAL HIGH (ref 0–200)
HDL: 139.8 mg/dL (ref >39.00)
LDL Cholesterol: 76 mg/dL (ref 0–99)
TRIGLYCERIDES: 91 mg/dL (ref 0.0–149.0)
VLDL: 18.2 mg/dL (ref 0.0–40.0)

## 2013-09-06 LAB — HEMOGLOBIN A1C: Hgb A1c MFr Bld: 5.4 % (ref 4.6–6.5)

## 2013-09-06 LAB — TSH: TSH: 0.77 u[IU]/mL (ref 0.35–4.50)

## 2013-09-06 NOTE — Progress Notes (Signed)
Pre visit review using our clinic review tool, if applicable. No additional management support is needed unless otherwise documented below in the visit note. 

## 2013-09-06 NOTE — Progress Notes (Signed)
   Subjective:    Patient ID: Mason Maddox, male    DOB: 1980/05/07, 33 y.o.   MRN: [id number]  HPI Comments: He returns for a physical and he tells me that he feels well and offers no complaints.     Review of Systems  Constitutional: Negative.  Negative for chills, diaphoresis and fatigue.  HENT: Negative.   Eyes: Negative.   Respiratory: Negative.  Negative for cough, choking and shortness of breath.   Cardiovascular: Negative.  Negative for chest pain, palpitations and leg swelling.  Gastrointestinal: Negative.  Negative for nausea, vomiting, abdominal pain, diarrhea and constipation.  Endocrine: Negative.   Genitourinary: Negative.  Negative for dysuria, urgency, hematuria, discharge, scrotal swelling, difficulty urinating, genital sores, penile pain and testicular pain.  Musculoskeletal: Negative.   Skin: Negative.   Allergic/Immunologic: Negative.   Neurological: Negative.  Negative for dizziness and light-headedness.  Hematological: Negative.  Negative for adenopathy. Does not bruise/bleed easily.       Objective:   Physical Exam  Vitals reviewed. Constitutional: He is oriented to person, place, and time. He appears well-developed and well-nourished. No distress.  HENT:  Head: Normocephalic and atraumatic.  Mouth/Throat: Oropharynx is clear and moist. No oropharyngeal exudate.  Eyes: Conjunctivae are normal. Right eye exhibits no discharge. Left eye exhibits no discharge. No scleral icterus.  Neck: Normal range of motion. Neck supple. No JVD present. No tracheal deviation present. No thyromegaly present.  Cardiovascular: Normal rate, regular rhythm, normal heart sounds and intact distal pulses.  Exam reveals no gallop and no friction rub.   No murmur heard. Pulmonary/Chest: Effort normal and breath sounds normal. No stridor. No respiratory distress. He has no wheezes. He has no rales. He exhibits no tenderness.  Abdominal: Soft. Bowel sounds are normal. He exhibits no  distension and no mass. There is no tenderness. There is no rebound and no guarding. Hernia confirmed negative in the right inguinal area and confirmed negative in the left inguinal area.  Genitourinary: Testes normal and penis normal. Right testis shows no mass, no swelling and no tenderness. Right testis is descended. Left testis shows no mass, no swelling and no tenderness. Left testis is descended. Circumcised. No penile erythema or penile tenderness. No discharge found.  Musculoskeletal: Normal range of motion. He exhibits no edema and no tenderness.  Lymphadenopathy:    He has no cervical adenopathy.       Right: No inguinal adenopathy present.       Left: No inguinal adenopathy present.  Neurological: He is oriented to person, place, and time.  Skin: Skin is warm and dry. No rash noted. He is not diaphoretic. No erythema. No pallor.  Psychiatric: He has a normal mood and affect. His behavior is normal. Judgment and thought content normal.     Lab Results  Component Value Date   GLUCOSE 123* 03/05/2010   CHOL 191 03/05/2010   TRIG 63.0 03/05/2010   HDL 95.70 03/05/2010   LDLCALC 83 03/05/2010   ALT 38 07/16/2006   AST 29 07/16/2006   NA 140 03/05/2010   K 4.0 03/05/2010   CL 104 03/05/2010   CREATININE 1.0 03/05/2010   BUN 16 03/05/2010   CO2 27 03/05/2010       Assessment & Plan:

## 2013-09-06 NOTE — Patient Instructions (Signed)

## 2013-09-06 NOTE — Assessment & Plan Note (Signed)
Exam done Tdap was updated Labs ordered Pt ed material was given

## 2013-09-06 NOTE — Assessment & Plan Note (Signed)
His A1C is normal 

## 2014-08-21 ENCOUNTER — Encounter: Payer: Self-pay | Admitting: Internal Medicine

## 2014-09-20 ENCOUNTER — Other Ambulatory Visit (INDEPENDENT_AMBULATORY_CARE_PROVIDER_SITE_OTHER): Payer: Managed Care, Other (non HMO)

## 2014-09-20 ENCOUNTER — Ambulatory Visit (INDEPENDENT_AMBULATORY_CARE_PROVIDER_SITE_OTHER): Payer: Managed Care, Other (non HMO) | Admitting: Internal Medicine

## 2014-09-20 ENCOUNTER — Encounter: Payer: Self-pay | Admitting: Internal Medicine

## 2014-09-20 VITALS — BP 114/68 | HR 88 | Temp 98.7°F | Resp 16 | Ht 68.0 in | Wt 176.0 lb

## 2014-09-20 DIAGNOSIS — Z0000 Encounter for general adult medical examination without abnormal findings: Secondary | ICD-10-CM

## 2014-09-20 DIAGNOSIS — Z202 Contact with and (suspected) exposure to infections with a predominantly sexual mode of transmission: Secondary | ICD-10-CM

## 2014-09-20 LAB — COMPREHENSIVE METABOLIC PANEL
ALBUMIN: 4.8 g/dL (ref 3.5–5.2)
ALT: 27 U/L (ref 0–53)
AST: 23 U/L (ref 0–37)
Alkaline Phosphatase: 86 U/L (ref 39–117)
BILIRUBIN TOTAL: 0.5 mg/dL (ref 0.2–1.2)
BUN: 12 mg/dL (ref 6–23)
CALCIUM: 9.7 mg/dL (ref 8.4–10.5)
CO2: 30 meq/L (ref 19–32)
Chloride: 102 mEq/L (ref 96–112)
Creatinine, Ser: 0.88 mg/dL (ref 0.40–1.50)
GFR: 127.44 mL/min (ref >60.00)
GLUCOSE: 108 mg/dL — AB (ref 70–99)
Potassium: 3.4 mEq/L — ABNORMAL LOW (ref 3.5–5.1)
SODIUM: 137 meq/L (ref 135–145)
Total Protein: 7.8 g/dL (ref 6.0–8.3)

## 2014-09-20 LAB — CBC WITH DIFFERENTIAL/PLATELET
BASOS ABS: 0 10*3/uL (ref 0.0–0.1)
BASOS PCT: 0.7 % (ref 0.0–3.0)
EOS ABS: 0.2 10*3/uL (ref 0.0–0.7)
EOS PCT: 3.4 % (ref 0.0–5.0)
HCT: 45.8 % (ref 39.0–52.0)
Hemoglobin: 15.3 g/dL (ref 13.0–17.0)
LYMPHS ABS: 1.2 10*3/uL (ref 0.7–4.0)
Lymphocytes Relative: 23.1 % (ref 12.0–46.0)
MCHC: 33.4 g/dL (ref 30.0–36.0)
MCV: 89.6 fl (ref 78.0–100.0)
MONO ABS: 0.4 10*3/uL (ref 0.1–1.0)
Monocytes Relative: 8.9 % (ref 3.0–12.0)
Neutro Abs: 3.2 10*3/uL (ref 1.4–7.7)
Neutrophils Relative %: 63.9 % (ref 43.0–77.0)
PLATELETS: 148 10*3/uL — AB (ref 150.0–400.0)
RBC: 5.11 Mil/uL (ref 4.22–5.81)
RDW: 13.4 % (ref 11.5–15.5)
WBC: 5 10*3/uL (ref 4.0–10.5)

## 2014-09-20 LAB — URINALYSIS, ROUTINE W REFLEX MICROSCOPIC
BILIRUBIN URINE: NEGATIVE
HGB URINE DIPSTICK: NEGATIVE
Ketones, ur: NEGATIVE
Leukocytes, UA: NEGATIVE
NITRITE: NEGATIVE
RBC / HPF: NONE SEEN (ref 0–?)
Total Protein, Urine: NEGATIVE
URINE GLUCOSE: NEGATIVE
Urobilinogen, UA: 0.2 (ref 0.0–1.0)
WBC UA: NONE SEEN (ref 0–?)
pH: 6 (ref 5.0–8.0)

## 2014-09-20 LAB — LIPID PANEL
CHOLESTEROL: 209 mg/dL — AB (ref 0–200)
HDL: 127.3 mg/dL (ref >39.00)
LDL CALC: 72 mg/dL (ref 0–99)
NONHDL: 81.7
TRIGLYCERIDES: 48 mg/dL (ref 0.0–149.0)
Total CHOL/HDL Ratio: 2
VLDL: 9.6 mg/dL (ref 0.0–40.0)

## 2014-09-20 LAB — TSH: TSH: 0.74 u[IU]/mL (ref 0.35–4.50)

## 2014-09-20 NOTE — Progress Notes (Signed)
Pre visit review using our clinic review tool, if applicable. No additional management support is needed unless otherwise documented below in the visit note. 

## 2014-09-20 NOTE — Progress Notes (Signed)
Subjective:  Patient ID: Mason Maddox, male    DOB: Jan 10, 1981  Age: 34 y.o. MRN: [id number]  CC: Exposure to STD and Annual Exam   HPI Mason Maddox presents for a CPX and STI screen, a male partner told him that she has trich and herpes and he wants to be checked for STI's. He feels well and offers no complaints.   No outpatient prescriptions prior to visit.   No facility-administered medications prior to visit.    ROS Review of Systems  Constitutional: Negative.  Negative for fever, chills, diaphoresis, appetite change and fatigue.  HENT: Negative.  Negative for trouble swallowing and voice change.   Eyes: Negative.   Respiratory: Negative.   Cardiovascular: Negative.   Gastrointestinal: Negative.  Negative for abdominal pain.  Endocrine: Negative.   Genitourinary: Negative.  Negative for dysuria, urgency, frequency, hematuria, flank pain, decreased urine volume, discharge, penile swelling, scrotal swelling, enuresis, difficulty urinating, genital sores, penile pain and testicular pain.  Musculoskeletal: Negative.   Skin: Negative.  Negative for rash.  Allergic/Immunologic: Negative.   Neurological: Negative.   Hematological: Negative.  Negative for adenopathy. Does not bruise/bleed easily.  Psychiatric/Behavioral: Negative.     Objective:  BP 114/68 mmHg  Pulse 88  Temp(Src) 98.7 F (37.1 C) (Oral)  Resp 16  Ht 5\' 8"  (1.727 m)  Wt 176 lb (79.833 kg)  BMI 26.77 kg/m2  SpO2 98%  BP Readings from Last 3 Encounters:  09/20/14 114/68  09/06/13 118/68  05/05/12 140/88    Wt Readings from Last 3 Encounters:  09/20/14 176 lb (79.833 kg)  09/06/13 181 lb 8 oz (82.328 kg)  05/05/12 183 lb 0.6 oz (83.026 kg)    Physical Exam  Constitutional: He is oriented to person, place, and time. He appears well-developed and well-nourished. No distress.  HENT:  Head: Normocephalic and atraumatic.  Mouth/Throat: Oropharynx is clear and moist. No oropharyngeal exudate.    Eyes: Conjunctivae are normal. Right eye exhibits no discharge. Left eye exhibits no discharge. No scleral icterus.  Neck: Normal range of motion. Neck supple. No JVD present. No tracheal deviation present. No thyromegaly present.  Cardiovascular: Normal rate, regular rhythm, normal heart sounds and intact distal pulses.  Exam reveals no gallop and no friction rub.   No murmur heard. Pulmonary/Chest: Effort normal and breath sounds normal. No stridor. No respiratory distress. He has no wheezes. He has no rales. He exhibits no tenderness.  Abdominal: Soft. Bowel sounds are normal. He exhibits no distension and no mass. There is no tenderness. There is no rebound and no guarding. Hernia confirmed negative in the right inguinal area and confirmed negative in the left inguinal area.  Genitourinary: Testes normal and penis normal. Right testis shows no mass, no swelling and no tenderness. Right testis is descended. Left testis shows no mass, no swelling and no tenderness. Left testis is descended. Circumcised. No penile erythema or penile tenderness. No discharge found.  Musculoskeletal: Normal range of motion. He exhibits no edema or tenderness.  Lymphadenopathy:    He has no cervical adenopathy.       Right: No inguinal adenopathy present.       Left: No inguinal adenopathy present.  Neurological: He is oriented to person, place, and time.  Skin: Skin is warm and dry. No rash noted. He is not diaphoretic. No erythema. No pallor.  Psychiatric: He has a normal mood and affect. His behavior is normal. Judgment and thought content normal.  Vitals reviewed.   Lab  Results  Component Value Date   WBC 5.0 09/20/2014   HGB 15.3 09/20/2014   HCT 45.8 09/20/2014   PLT 148.0* 09/20/2014   GLUCOSE 108* 09/20/2014   CHOL 209* 09/20/2014   TRIG 48.0 09/20/2014   HDL 127.30 09/20/2014   LDLCALC 72 09/20/2014   ALT 27 09/20/2014   AST 23 09/20/2014   NA 137 09/20/2014   K 3.4* 09/20/2014   CL 102  09/20/2014   CREATININE 0.88 09/20/2014   BUN 12 09/20/2014   CO2 30 09/20/2014   TSH 0.74 09/20/2014   HGBA1C 5.4 09/06/2013    No results found.  Assessment & Plan:   Mason Maddox was seen today for exposure to std and annual exam.  Diagnoses and all orders for this visit:  STD exposure - I see no evidence of infection today, will check labs, will treat if indicated, safe sex practices were discussed Orders: -     Urinalysis, Routine w reflex microscopic (not at [facility]); Future -     HSV(herpes smplx)abs-1+2(IgG+IgM)-bld; Future -     HIV antibody; Future -     GC/chlamydia probe amp, urine; Future -     RPR; Future  Routine general medical examination at a health care facility - exam done, vaccines were reviewed and updated, labs ordered Orders: -     Lipid panel; Future -     Comprehensive metabolic panel; Future -     CBC with Differential/Platelet; Future -     TSH; Future   Mason Maddox does not currently have medications on file.  No orders of the defined types were placed in this encounter.   See AVS for instructions about healthy living and anticipatory guidance.  Follow-up: Return if symptoms worsen or fail to improve.  Sanda Linger, MD

## 2014-09-20 NOTE — Patient Instructions (Signed)

## 2014-09-21 ENCOUNTER — Encounter: Payer: Self-pay | Admitting: Internal Medicine

## 2014-09-21 LAB — RPR

## 2014-09-21 LAB — GC/CHLAMYDIA PROBE AMP, URINE
Chlamydia, Swab/Urine, PCR: NEGATIVE
GC Probe Amp, Urine: NEGATIVE

## 2014-09-21 LAB — HIV ANTIBODY (ROUTINE TESTING W REFLEX): HIV 1&2 Ab, 4th Generation: NONREACTIVE

## 2014-09-22 LAB — HSV(HERPES SMPLX)ABS-I+II(IGG+IGM)-BLD
HSV 1 Glycoprotein G Ab, IgG: <0.1 IV
Herpes Simplex Vrs I&II-IgM Ab (EIA): 0.33 INDEX

## 2015-05-03 ENCOUNTER — Ambulatory Visit (INDEPENDENT_AMBULATORY_CARE_PROVIDER_SITE_OTHER): Payer: Managed Care, Other (non HMO) | Admitting: Emergency Medicine

## 2015-05-03 VITALS — BP 122/76 | HR 72 | Temp 98.6°F | Resp 18 | Ht 66.0 in | Wt 179.0 lb

## 2015-05-03 DIAGNOSIS — S335XXA Sprain of ligaments of lumbar spine, initial encounter: Secondary | ICD-10-CM

## 2015-05-03 MED ORDER — NAPROXEN SODIUM 550 MG PO TABS: 550.0000 mg | ORAL_TABLET | Freq: Two times a day (BID) | ORAL | Status: DC

## 2015-05-03 MED ORDER — TRAMADOL HCL 50 MG PO TABS: 50.0000 mg | ORAL_TABLET | Freq: Three times a day (TID) | ORAL | Status: DC | PRN

## 2015-05-03 MED ORDER — CYCLOBENZAPRINE HCL 5 MG PO TABS: 5.0000 mg | ORAL_TABLET | Freq: Three times a day (TID) | ORAL | Status: DC | PRN

## 2015-05-03 NOTE — Patient Instructions (Signed)

## 2015-05-03 NOTE — Progress Notes (Signed)
Subjective:  Patient ID: Mason Maddox, male    DOB: 01-22-1981  Age: 35 y.o. MRN: [id number]  CC: Back Pain   HPI Mason Maddox presents   She developed right low back pain last evening. He has no history of direct injury or overuse. He has his pain is nonradiating has no  Numbness tingling or weakness in his leg. No history of prior back pain. He's had no improvement with over-the-counter medication  History Mason Maddox has no past medical history on file.   He has no past surgical history on file.   His  family history includes Hypertension in his brother, father, and mother. There is no history of Alcohol abuse, Cancer, Diabetes, COPD, Depression, Drug abuse, Early death, Heart disease, Hyperlipidemia, Kidney disease, or Stroke.  He   reports that he has never smoked. He has never used smokeless tobacco. He reports that he drinks about 1.8 oz of alcohol per week. He reports that he does not use illicit drugs.  No outpatient prescriptions prior to visit.   No facility-administered medications prior to visit.    Social History   Social History  . Marital Status: Married    Spouse Name: N/A  . Number of Children: 2  . Years of Education: 16   Occupational History  . retail Drugstore mgr     [employer]   Social History Main Topics  . Smoking status: Never Smoker   . Smokeless tobacco: Never Used  . Alcohol Use: 1.8 oz/week    3 Standard drinks or equivalent per week  . Drug Use: No  . Sexual Activity:    Partners: Female    Pharmacist, hospital Protection: Condom   Other Topics Concern  . None   Social History Narrative   Investment banker, operational- BS. Married- '09. 2 dtrs -'06, '09. 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  HENT: Negative for congestion, ear pain, postnasal drip, sinus pressure and sore throat.   Eyes: Negative for pain and redness.  Respiratory:  Negative for cough, shortness of breath and wheezing.   Cardiovascular: Negative for leg swelling.  Gastrointestinal: Negative for nausea, vomiting, abdominal pain, diarrhea, constipation and blood in stool.  Endocrine: Negative for polyuria.  Genitourinary: Negative for dysuria, urgency, frequency and flank pain.  Musculoskeletal: Positive for back pain. Negative for gait problem.  Skin: Negative for rash.  Neurological: Negative for weakness and headaches.  Psychiatric/Behavioral: Negative for confusion and decreased concentration. The patient is not nervous/anxious.     Objective:  BP 122/76 mmHg  Pulse 72  Temp(Src) 98.6 F (37 C) (Oral)  Resp 18  Ht  (1.676 m)  Wt 179 lb (81.194 kg)  BMI 28.91 kg/m2  SpO2 98%  Physical Exam  Constitutional: He is oriented to person, place, and time. He appears well-developed and well-nourished. No distress.  HENT:  Head: Normocephalic and atraumatic.  Right Ear: External ear normal.  Left Ear: External ear normal.  Nose: Nose normal.  Eyes: Conjunctivae and EOM are normal. Pupils are equal, round, and reactive to light. No scleral icterus.  Neck: Normal range of motion. Neck supple. No tracheal deviation present.  Cardiovascular: Normal rate, regular rhythm and normal heart sounds.   Pulmonary/Chest: Effort normal. No respiratory distress. He has no wheezes. He has no rales.  Abdominal: He exhibits no mass. There is no tenderness. There is no rebound and no guarding.  Musculoskeletal: He exhibits no edema.       Lumbar back: He exhibits tenderness.  Lymphadenopathy:    He has no cervical adenopathy.  Neurological: He is alert and oriented to person, place, and time. Coordination normal.  Skin: Skin is warm and dry. No rash noted.  Psychiatric: He has a normal mood and affect. His behavior is normal.      Assessment & Plan:   Mason Maddox was seen today for back pain.  Diagnoses and all orders for this visit:  Sprain of lumbar  region, initial encounter  Other orders -     naproxen sodium (ANAPROX DS) 550 MG tablet; Take 1 tablet (550 mg total) by mouth 2 (two) times daily with a meal. -     cyclobenzaprine (FLEXERIL) 5 MG tablet; Take 1 tablet (5 mg total) by mouth 3 (three) times daily as needed for muscle spasms. -     traMADol (ULTRAM) 50 MG tablet; Take 1 tablet (50 mg total) by mouth every 8 (eight) hours as needed.  I am having Mr. Mason Maddox start on naproxen sodium, cyclobenzaprine, and traMADol.  Meds ordered this encounter  Medications  . naproxen sodium (ANAPROX DS) 550 MG tablet    Sig: Take 1 tablet (550 mg total) by mouth 2 (two) times daily with a meal.    Dispense:  40 tablet    Refill:  0  . cyclobenzaprine (FLEXERIL) 5 MG tablet    Sig: Take 1 tablet (5 mg total) by mouth 3 (three) times daily as needed for muscle spasms.    Dispense:  30 tablet    Refill:  1  . traMADol (ULTRAM) 50 MG tablet    Sig: Take 1 tablet (50 mg total) by mouth every 8 (eight) hours as needed.    Dispense:  30 tablet    Refill:  0    Appropriate red flag conditions were discussed with the patient as well as actions that should be taken.  Patient expressed his understanding.  Follow-up: Return if symptoms worsen or fail to improve.  Carmelina Dane, MD

## 2016-01-11 ENCOUNTER — Encounter: Payer: Self-pay | Admitting: Nurse Practitioner

## 2016-01-11 ENCOUNTER — Ambulatory Visit (INDEPENDENT_AMBULATORY_CARE_PROVIDER_SITE_OTHER): Payer: Managed Care, Other (non HMO) | Admitting: Nurse Practitioner

## 2016-01-11 VITALS — BP 134/88 | HR 94 | Temp 98.7°F | Resp 16 | Wt 186.0 lb

## 2016-01-11 DIAGNOSIS — N529 Male erectile dysfunction, unspecified: Secondary | ICD-10-CM | POA: Diagnosis not present

## 2016-01-11 MED ORDER — TADALAFIL 5 MG PO TABS: 5.0000 mg | ORAL_TABLET | Freq: Every day | ORAL | 0 refills | Status: DC | PRN

## 2016-01-11 NOTE — Progress Notes (Signed)
Subjective:  Patient ID: Mason Maddox, male    DOB: July 10, 1980  Age: 35 y.o. MRN: [id number]  CC: Erectile Dysfunction (ongoing for 1year.)   Erectile Dysfunction  This is a chronic problem. The current episode started more than 1 year ago. The problem has been gradually worsening since onset. Ashby Dawes of difficulty: unable to achieve an erection even though he has desire and interest. He reports no anxiety, decreased libido or performance anxiety. Irritative symptoms do not include frequency, nocturia or urgency. Obstructive symptoms do not include dribbling, incomplete emptying, an intermittent stream, a slower stream, straining or a weak stream. Pertinent negatives include no chills, dysuria, genital pain, hematuria, hesitancy or inability to urinate. Nothing aggravates the symptoms. Past treatments include nothing. He has had no adverse reactions caused by medications. There are no known risk factors.  evaluated by urologist last year.  No treatment initiated.  Outpatient Medications Prior to Visit  Medication Sig Dispense Refill  . cyclobenzaprine (FLEXERIL) 5 MG tablet Take 1 tablet (5 mg total) by mouth 3 (three) times daily as needed for muscle spasms. 30 tablet 1  . naproxen sodium (ANAPROX DS) 550 MG tablet Take 1 tablet (550 mg total) by mouth 2 (two) times daily with a meal. 40 tablet 0  . traMADol (ULTRAM) 50 MG tablet Take 1 tablet (50 mg total) by mouth every 8 (eight) hours as needed. 30 tablet 0   No facility-administered medications prior to visit.     ROS See HPI  Objective:  BP 134/88   Pulse 94   Temp 98.7 F (37.1 C) (Oral)   Resp 16   Wt 186 lb (84.4 kg)   SpO2 97%   BMI 30.02 kg/m   BP Readings from Last 3 Encounters:  01/11/16 134/88  05/03/15 122/76  09/20/14 114/68    Wt Readings from Last 3 Encounters:  01/11/16 186 lb (84.4 kg)  05/03/15 179 lb (81.2 kg)  09/20/14 176 lb (79.8 kg)    Physical Exam  Constitutional: He is oriented to person,  place, and time. No distress.  Neck: Normal range of motion. Neck supple.  Pulmonary/Chest: Effort normal.  Abdominal: Soft. Bowel sounds are normal.  Neurological: He is alert and oriented to person, place, and time.  Skin: Skin is warm and dry.  Psychiatric: He has a normal mood and affect. His behavior is normal.  Vitals reviewed.   Lab Results  Component Value Date   WBC 5.0 09/20/2014   HGB 15.3 09/20/2014   HCT 45.8 09/20/2014   PLT 148.0 (L) 09/20/2014   GLUCOSE 108 (H) 09/20/2014   CHOL 209 (H) 09/20/2014   TRIG 48.0 09/20/2014   HDL 127.30 09/20/2014   LDLCALC 72 09/20/2014   ALT 27 09/20/2014   AST 23 09/20/2014   NA 137 09/20/2014   K 3.4 (L) 09/20/2014   CL 102 09/20/2014   CREATININE 0.88 09/20/2014   BUN 12 09/20/2014   CO2 30 09/20/2014   TSH 0.74 09/20/2014   HGBA1C 5.4 09/06/2013    No results found.  Assessment & Plan:   Deryck was seen today for erectile dysfunction.  Diagnoses and all orders for this visit:  Erectile dysfunction, unspecified erectile dysfunction type -     Testosterone; Future -     PSA; Future -     tadalafil (CIALIS) 5 MG tablet; Take 1 tablet (5 mg total) by mouth daily as needed for erectile dysfunction.   I have discontinued Mr. Weyrauch's naproxen sodium, cyclobenzaprine, and  traMADol. I am also having him start on tadalafil.  Meds ordered this encounter  Medications  . tadalafil (CIALIS) 5 MG tablet    Sig: Take 1 tablet (5 mg total) by mouth daily as needed for erectile dysfunction.    Dispense:  30 tablet    Refill:  0    Order Specific Question:   Supervising Provider    Answer:   Tresa GarterPLOTNIKOV, ALEKSEI V [[id number]]    Follow-up: Return in about 4 weeks (around 02/08/2016) for erectile dysfuction.  Alysia Pennaharlotte Kimyata Milich, NP

## 2016-01-11 NOTE — Progress Notes (Signed)
Pre visit review using our clinic review tool, if applicable. No additional management support is needed unless otherwise documented below in the visit note. 

## 2016-01-11 NOTE — Patient Instructions (Addendum)
Consider referral to urologist if no improvement with cialis.  Tadalafil tablets (Cialis) What is this medicine? TADALAFIL (tah DA la fil) is used to treat erection problems in men. It is also used for enlargement of the prostate gland in men, a condition called benign prostatic hyperplasia or BPH. This medicine improves urine flow and reduces BPH symptoms. This medicine can also treat both erection problems and BPH when they occur together. This medicine may be used for other purposes; ask your health care provider or pharmacist if you have questions. What should I tell my health care provider before I take this medicine? They need to know if you have any of these conditions: -bleeding disorders -eye or vision problems, including a rare inherited eye disease called retinitis pigmentosa -anatomical deformation of the penis, Peyronie's disease, or history of priapism (painful and prolonged erection) -heart disease, angina, a history of heart attack, irregular heart beats, or other heart problems -high or low blood pressure -history of blood diseases, like sickle cell anemia or leukemia -history of stomach bleeding -kidney disease -liver disease -stroke -an unusual or allergic reaction to tadalafil, other medicines, foods, dyes, or preservatives -pregnant or trying to get pregnant -breast-feeding How should I use this medicine? Take this medicine by mouth with a glass of water. Follow the directions on the prescription label. You may take this medicine with or without meals. When this medicine is used for erection problems, your doctor may prescribe it to be taken once daily or as needed. If you are taking the medicine as needed, you may be able to have sexual activity 30 minutes after taking it and for up to 36 hours after taking it. Whether you are taking the medicine as needed or once daily, you should not take more than one dose per day. If you are taking this medicine for symptoms of benign  prostatic hyperplasia (BPH) or to treat both BPH and an erection problem, take the dose once daily at about the same time each day. Do not take your medicine more often than directed. Talk to your pediatrician regarding the use of this medicine in children. Special care may be needed. Overdosage: If you think you have taken too much of this medicine contact a poison control center or emergency room at once. NOTE: This medicine is only for you. Do not share this medicine with others. What if I miss a dose? If you are taking this medicine as needed for erection problems, this does not apply. If you miss a dose while taking this medicine once daily for an erection problem, benign prostatic hyperplasia, or both, take it as soon as you remember, but do not take more than one dose per day. What may interact with this medicine? Do not take this medicine with any of the following medications: -nitrates like amyl nitrite, isosorbide dinitrate, isosorbide mononitrate, nitroglycerin -other medicines for erectile dysfunction like avanafil, sildenafil, vardenafil -other tadalafil products (Adcirca) -riociguat This medicine may also interact with the following medications: -certain drugs for high blood pressure -certain drugs for the treatment of HIV infection or AIDS -certain drugs used for fungal or yeast infections, like fluconazole, itraconazole, ketoconazole, and voriconazole -certain drugs used for seizures like carbamazepine, phenytoin, and phenobarbital -grapefruit juice -macrolide antibiotics like clarithromycin, erythromycin, troleandomycin -medicines for prostate problems -rifabutin, rifampin or rifapentine This list may not describe all possible interactions. Give your health care provider a list of all the medicines, herbs, non-prescription drugs, or dietary supplements you use. Also tell them if  you smoke, drink alcohol, or use illegal drugs. Some items may interact with your medicine. What  should I watch for while using this medicine? If you notice any changes in your vision while taking this drug, call your doctor or health care professional as soon as possible. Stop using this medicine and call your health care provider right away if you have a loss of sight in one or both eyes. Contact your doctor or health care professional right away if the erection lasts longer than 4 hours or if it becomes painful. This may be a sign of serious problem and must be treated right away to prevent permanent damage. If you experience symptoms of nausea, dizziness, chest pain or arm pain upon initiation of sexual activity after taking this medicine, you should refrain from further activity and call your doctor or health care professional as soon as possible. Do not drink alcohol to excess (examples, 5 glasses of wine or 5 shots of whiskey) when taking this medicine. When taken in excess, alcohol can increase your chances of getting a headache or getting dizzy, increasing your heart rate or lowering your blood pressure. Using this medicine does not protect you or your partner against HIV infection (the virus that causes AIDS) or other sexually transmitted diseases. What side effects may I notice from receiving this medicine? Side effects that you should report to your doctor or health care professional as soon as possible: -allergic reactions like skin rash, itching or hives, swelling of the face, lips, or tongue -breathing problems -changes in hearing -changes in vision -chest pain -fast, irregular heartbeat -prolonged or painful erection -seizures Side effects that usually do not require medical attention (report to your doctor or health care professional if they continue or are bothersome): -back pain -dizziness -flushing -headache -indigestion -muscle aches -nausea -stuffy or runny nose This list may not describe all possible side effects. Call your doctor for medical advice about side  effects. You may report side effects to FDA at 1-800-FDA-1088. Where should I keep my medicine? Keep out of the reach of children. Store at room temperature between 15 and 30 degrees C (59 and 86 degrees F). Throw away any unused medicine after the expiration date. NOTE: This sheet is a summary. It may not cover all possible information. If you have questions about this medicine, talk to your doctor, pharmacist, or health care provider.    2016, Elsevier/Gold Standard. (2013-08-19 13:15:49) Erectile Dysfunction Erectile dysfunction is the inability to get or sustain a good enough erection to have sexual intercourse. Erectile dysfunction may involve:  Inability to get an erection.  Lack of enough hardness to allow penetration.  Loss of the erection before sex is finished.  Premature ejaculation. CAUSES  Certain drugs, such as:  Pain relievers.  Antihistamines.  Antidepressants.  Blood pressure medicines.  Water pills (diuretics).  Ulcer medicines.  Muscle relaxants.  Illegal drugs.  Excessive drinking.  Psychological causes, such as:  Anxiety.  Depression.  Sadness.  Exhaustion.  Performance fear.  Stress.  Physical causes, such as:  Artery problems. This may include diabetes, smoking, liver disease, or atherosclerosis.  High blood pressure.  Hormonal problems, such as low testosterone.  Obesity.  Nerve problems. This may include back or pelvic injuries, diabetes mellitus, multiple sclerosis, or Parkinson disease. SYMPTOMS  Inability to get an erection.  Lack of enough hardness to allow penetration.  Loss of the erection before sex is finished.  Premature ejaculation.  Normal erections at some times, but with frequent  unsatisfactory episodes.  Orgasms that are not satisfactory in sensation or frequency.  Low sexual satisfaction in either partner because of erection problems.  A curved penis occurring with erection. The curve may cause  pain or may be too curved to allow for intercourse.  Never having nighttime erections. DIAGNOSIS Your caregiver can often diagnose this condition by:  Performing a physical exam to find other diseases or specific problems with the penis.  Asking you detailed questions about the problem.  Performing blood tests to check for diabetes mellitus or to measure hormone levels.  Performing urine tests to find other underlying health conditions.  Performing an ultrasound exam to check for scarring.  Performing a test to check blood flow to the penis.  Doing a sleep study at home to measure nighttime erections. TREATMENT   You may be prescribed medicines by mouth.  You may be given medicine injections into the penis.  You may be prescribed a vacuum pump with a ring.  Penile implant surgery may be performed. You may receive:  An inflatable implant.  A semirigid implant.  Blood vessel surgery may be performed. HOME CARE INSTRUCTIONS  If you are prescribed oral medicine, you should take the medicine as prescribed. Do not increase the dosage without first discussing it with your physician.  If you are using self-injections, be careful to avoid any veins that are on the surface of the penis. Apply pressure to the injection site for 5 minutes.  If you are using a vacuum pump, make sure you have read the instructions before using it. Discuss any questions with your physician before taking the pump home. SEEK MEDICAL CARE IF:  You experience pain that is not responsive to the pain medicine you have been prescribed.  You experience nausea or vomiting. SEEK IMMEDIATE MEDICAL CARE IF:   When taking oral or injectable medications, you experience an erection that lasts longer than 4 hours. If your physician is unavailable, go to the nearest emergency room for evaluation. An erection that lasts much longer than 4 hours can result in permanent damage to your penis.  You have pain that is  severe.  You develop redness, severe pain, or severe swelling of your penis.  You have redness spreading up into your groin or lower abdomen.  You are unable to pass your urine.   This information is not intended to replace advice given to you by your health care provider. Make sure you discuss any questions you have with your health care provider.   Document Released: 03/28/2000 Document Revised: 12/01/2012 Document Reviewed: 09/02/2012 Elsevier Interactive Patient Education [employer]

## 2016-06-24 ENCOUNTER — Other Ambulatory Visit: Payer: Self-pay | Admitting: Nurse Practitioner

## 2016-06-24 DIAGNOSIS — N529 Male erectile dysfunction, unspecified: Secondary | ICD-10-CM

## 2016-06-26 NOTE — Telephone Encounter (Signed)
Routing to dr Yetta Barrejones, do you want office visit before any further refills?--please advise, I will call patient back, thanks

## 2016-06-26 NOTE — Telephone Encounter (Signed)
Patient has made appt to see you on 3/20 (Tuesday)---it is earliest he could get here----can you send in very small amt for cialis refill until he gets here on Tuesday---please advise, thanks

## 2016-06-26 NOTE — Telephone Encounter (Signed)
OV required

## 2016-06-26 NOTE — Telephone Encounter (Signed)
Routing to [place]---do you want to continue ordering this med, or should I put med under dr Yetta Barrejones name---please advise, thanks

## 2016-07-01 ENCOUNTER — Ambulatory Visit: Payer: Managed Care, Other (non HMO) | Admitting: Internal Medicine

## 2016-07-10 ENCOUNTER — Ambulatory Visit (INDEPENDENT_AMBULATORY_CARE_PROVIDER_SITE_OTHER): Payer: 59 | Admitting: Internal Medicine

## 2016-07-10 ENCOUNTER — Other Ambulatory Visit (INDEPENDENT_AMBULATORY_CARE_PROVIDER_SITE_OTHER): Payer: 59

## 2016-07-10 ENCOUNTER — Encounter: Payer: Self-pay | Admitting: Internal Medicine

## 2016-07-10 VITALS — BP 130/90 | HR 96 | Temp 98.6°F | Resp 16 | Ht 66.0 in | Wt 185.5 lb

## 2016-07-10 DIAGNOSIS — Z0000 Encounter for general adult medical examination without abnormal findings: Secondary | ICD-10-CM

## 2016-07-10 DIAGNOSIS — R7309 Other abnormal glucose: Secondary | ICD-10-CM

## 2016-07-10 DIAGNOSIS — N521 Erectile dysfunction due to diseases classified elsewhere: Secondary | ICD-10-CM | POA: Diagnosis not present

## 2016-07-10 LAB — HEMOGLOBIN A1C: Hgb A1c MFr Bld: 5 % (ref 4.6–6.5)

## 2016-07-10 LAB — CBC WITH DIFFERENTIAL/PLATELET
BASOS ABS: 0 10*3/uL (ref 0.0–0.1)
Basophils Relative: 0.7 % (ref 0.0–3.0)
EOS ABS: 0.1 10*3/uL (ref 0.0–0.7)
Eosinophils Relative: 3 % (ref 0.0–5.0)
HEMATOCRIT: 47.1 % (ref 39.0–52.0)
Hemoglobin: 15.5 g/dL (ref 13.0–17.0)
LYMPHS PCT: 23.7 % (ref 12.0–46.0)
Lymphs Abs: 1 10*3/uL (ref 0.7–4.0)
MCHC: 32.9 g/dL (ref 30.0–36.0)
MCV: 92 fl (ref 78.0–100.0)
Monocytes Absolute: 0.5 10*3/uL (ref 0.1–1.0)
Monocytes Relative: 11 % (ref 3.0–12.0)
NEUTROS ABS: 2.6 10*3/uL (ref 1.4–7.7)
NEUTROS PCT: 61.6 % (ref 43.0–77.0)
PLATELETS: 160 10*3/uL (ref 150.0–400.0)
RBC: 5.12 Mil/uL (ref 4.22–5.81)
RDW: 14 % (ref 11.5–15.5)
WBC: 4.3 10*3/uL (ref 4.0–10.5)

## 2016-07-10 LAB — COMPREHENSIVE METABOLIC PANEL
ALBUMIN: 5 g/dL (ref 3.5–5.2)
ALK PHOS: 76 U/L (ref 39–117)
ALT: 35 U/L (ref 0–53)
AST: 24 U/L (ref 0–37)
BILIRUBIN TOTAL: 0.5 mg/dL (ref 0.2–1.2)
BUN: 14 mg/dL (ref 6–23)
CALCIUM: 9.6 mg/dL (ref 8.4–10.5)
CO2: 28 meq/L (ref 19–32)
CREATININE: 0.87 mg/dL (ref 0.40–1.50)
Chloride: 102 mEq/L (ref 96–112)
GFR: 127.79 mL/min (ref >60.00)
Glucose, Bld: 88 mg/dL (ref 70–99)
Potassium: 4.3 mEq/L (ref 3.5–5.1)
Sodium: 139 mEq/L (ref 135–145)
TOTAL PROTEIN: 7.8 g/dL (ref 6.0–8.3)

## 2016-07-10 LAB — URINALYSIS, ROUTINE W REFLEX MICROSCOPIC
Bilirubin Urine: NEGATIVE
Hgb urine dipstick: NEGATIVE
Ketones, ur: NEGATIVE
LEUKOCYTES UA: NEGATIVE
Nitrite: NEGATIVE
PH: 6 (ref 5.0–8.0)
RBC / HPF: NONE SEEN (ref 0–?)
SPECIFIC GRAVITY, URINE: 1.02 (ref 1.000–1.030)
Total Protein, Urine: NEGATIVE
Urine Glucose: NEGATIVE
Urobilinogen, UA: 0.2 (ref 0.0–1.0)

## 2016-07-10 LAB — LIPID PANEL
CHOL/HDL RATIO: 2
Cholesterol: 248 mg/dL — ABNORMAL HIGH (ref 0–200)
HDL: 120.2 mg/dL (ref >39.00)
LDL CALC: 109 mg/dL — AB (ref 0–99)
NonHDL: 127.51
TRIGLYCERIDES: 91 mg/dL (ref 0.0–149.0)
VLDL: 18.2 mg/dL (ref 0.0–40.0)

## 2016-07-10 LAB — TSH: TSH: 1.27 u[IU]/mL (ref 0.35–4.50)

## 2016-07-10 LAB — PSA: PSA: 0.52 ng/mL (ref 0.10–4.00)

## 2016-07-10 MED ORDER — VARDENAFIL HCL 20 MG PO TABS: 20.0000 mg | ORAL_TABLET | Freq: Every day | ORAL | 5 refills | Status: DC | PRN

## 2016-07-10 NOTE — Progress Notes (Signed)
Subjective:  Patient ID: Mason Maddox, male    DOB: [birth year]  Age: 36 y.o. MRN: [id number]  CC: Annual Exam   HPI Mason Maddox presents for a CPX. He complains of chronic issues with ED but has a normal libido. He has tried 5 mg Cialis but wants to know if there is another option that he can take as needed instead of daily. He completed a divorce 6 months ago and has some ruminations and regrets but denies signs or symptoms consistent with depression.  Outpatient Medications Prior to Visit  Medication Sig Dispense Refill  . tadalafil (CIALIS) 5 MG tablet Take 1 tablet (5 mg total) by mouth daily as needed for erectile dysfunction. 30 tablet 0   No facility-administered medications prior to visit.     ROS Review of Systems  Constitutional: Negative for fatigue and unexpected weight change.  HENT: Negative.   Eyes: Negative.   Respiratory: Negative.  Negative for chest tightness and shortness of breath.   Cardiovascular: Negative.   Gastrointestinal: Negative.  Negative for abdominal pain, constipation, diarrhea, nausea and vomiting.  Genitourinary: Negative.  Negative for difficulty urinating, discharge, dysuria, enuresis, flank pain, frequency, genital sores, hematuria, penile pain, penile swelling, scrotal swelling, testicular pain and urgency.  Musculoskeletal: Negative.  Negative for back pain, myalgias and neck pain.  Skin: Negative.   Psychiatric/Behavioral: Negative.     Objective:  BP 130/90 (BP Location: Left Arm, Patient Position: Sitting, Cuff Size: Normal)   Pulse 96   Temp 98.6 F (37 C) (Oral)   Resp 16   Ht 5\' 6"  (1.676 m)   Wt 185 lb 8 oz (84.1 kg)   SpO2 98%   BMI 29.94 kg/m   BP Readings from Last 3 Encounters:  07/10/16 130/90  01/11/16 134/88  05/03/15 122/76    Wt Readings from Last 3 Encounters:  07/10/16 185 lb 8 oz (84.1 kg)  01/11/16 186 lb (84.4 kg)  05/03/15 179 lb (81.2 kg)    Physical Exam  Constitutional: He is oriented to  person, place, and time. No distress.  HENT:  Mouth/Throat: Oropharynx is clear and moist. No oropharyngeal exudate.  Eyes: Conjunctivae are normal. Right eye exhibits no discharge. Left eye exhibits no discharge. No scleral icterus.  Neck: Normal range of motion. Neck supple. No JVD present. No tracheal deviation present. No thyromegaly present.  Cardiovascular: Normal rate, regular rhythm, normal heart sounds and intact distal pulses.  Exam reveals no gallop and no friction rub.   No murmur heard. Pulmonary/Chest: Effort normal and breath sounds normal. No stridor. No respiratory distress. He has no wheezes. He has no rales. He exhibits no tenderness.  Abdominal: Soft. Bowel sounds are normal. He exhibits no distension and no mass. There is no tenderness. There is no rebound and no guarding. Hernia confirmed negative in the right inguinal area and confirmed negative in the left inguinal area.  Genitourinary: Testes normal and penis normal. Right testis shows no mass, no swelling and no tenderness. Right testis is descended. Left testis shows no mass, no swelling and no tenderness. Left testis is descended. Cremasteric reflex is not absent on the left side. Circumcised. No penile erythema or penile tenderness. No discharge found.  Musculoskeletal: Normal range of motion. He exhibits no edema, tenderness or deformity.  Lymphadenopathy:    He has no cervical adenopathy.       Right: No inguinal adenopathy present.       Left: No inguinal adenopathy present.  Neurological: He  is oriented to person, place, and time.  Skin: Skin is warm and dry. No rash noted. He is not diaphoretic. No erythema. No pallor.  Psychiatric: He has a normal mood and affect. His behavior is normal. Judgment and thought content normal.  Vitals reviewed.   Lab Results  Component Value Date   WBC 4.3 07/10/2016   HGB 15.5 07/10/2016   HCT 47.1 07/10/2016   PLT 160.0 07/10/2016   GLUCOSE 88 07/10/2016   CHOL 248 (H)  07/10/2016   TRIG 91.0 07/10/2016   HDL 120.20 07/10/2016   LDLCALC 109 (H) 07/10/2016   ALT 35 07/10/2016   AST 24 07/10/2016   NA 139 07/10/2016   K 4.3 07/10/2016   CL 102 07/10/2016   CREATININE 0.87 07/10/2016   BUN 14 07/10/2016   CO2 28 07/10/2016   TSH 1.27 07/10/2016   PSA 0.52 07/10/2016   HGBA1C 5.0 07/10/2016    No results found.  Assessment & Plan:   Mason Maddox was seen today for annual exam.  Diagnoses and all orders for this visit:  Routine general medical examination at a health care facility- Mason Maddox completed, labs ordered and reviewed, vaccines reviewed and updated, patient education material was given. -     Lipid panel; Future -     Comprehensive metabolic panel; Future -     CBC with Differential/Platelet; Future -     TSH; Future -     Urinalysis, Routine w reflex microscopic; Future  Other abnormal glucose- his blood sugars are normal now -     Hemoglobin A1c; Future  Erectile dysfunction due to diseases classified elsewhere- will try Levitra 20 mg a day, his examination and lab work is negative for any metabolic, malignant, or structural causes for ED. -     PSA; Future -     Testosterone Total,Free,Bio, Males; Future -     vardenafil (LEVITRA) 20 MG tablet; Take 1 tablet (20 mg total) by mouth daily as needed for erectile dysfunction.   I have discontinued Mason Maddox's tadalafil. I am also having him start on vardenafil.  Meds ordered this encounter  Medications  . vardenafil (LEVITRA) 20 MG tablet    Sig: Take 1 tablet (20 mg total) by mouth daily as needed for erectile dysfunction.    Dispense:  8 tablet    Refill:  5     Follow-up: Return if symptoms worsen or fail to improve.  Sanda Linger, MD

## 2016-07-10 NOTE — Progress Notes (Signed)
Pre visit review using our clinic review tool, if applicable. No additional management support is needed unless otherwise documented below in the visit note. 

## 2016-07-10 NOTE — Patient Instructions (Signed)
 Health Maintenance, Male A healthy lifestyle and preventive care is important for your health and wellness. Ask your health care provider about what schedule of regular examinations is right for you. What should I know about weight and diet?  Eat a Healthy Diet  Eat plenty of vegetables, fruits, whole grains, low-fat dairy products, and lean protein.  Do not eat a lot of foods high in solid fats, added sugars, or salt. Maintain a Healthy Weight  Regular exercise can help you achieve or maintain a healthy weight. You should:  Do at least 150 minutes of exercise each week. The exercise should increase your heart rate and make you sweat (moderate-intensity exercise).  Do strength-training exercises at least twice a week. Watch Your Levels of Cholesterol and Blood Lipids  Have your blood tested for lipids and cholesterol every 5 years starting at 35 years of age. If you are at high risk for heart disease, you should start having your blood tested when you are 36 years old. You may need to have your cholesterol levels checked more often if:  Your lipid or cholesterol levels are high.  You are older than 36 years of age.  You are at high risk for heart disease. What should I know about cancer screening? Many types of cancers can be detected early and may often be prevented. Lung Cancer  You should be screened every year for lung cancer if:  You are a current smoker who has smoked for at least 30 years.  You are a former smoker who has quit within the past 15 years.  Talk to your health care provider about your screening options, when you should start screening, and how often you should be screened. Colorectal Cancer  Routine colorectal cancer screening usually begins at 36 years of age and should be repeated every 5-10 years until you are 36 years old. You may need to be screened more often if early forms of precancerous polyps or small growths are found. Your health care provider  may recommend screening at an earlier age if you have risk factors for colon cancer.  Your health care provider may recommend using home test kits to check for hidden blood in the stool.  A small camera at the end of a tube can be used to examine your colon (sigmoidoscopy or colonoscopy). This checks for the earliest forms of colorectal cancer. Prostate and Testicular Cancer  Depending on your age and overall health, your health care provider may do certain tests to screen for prostate and testicular cancer.  Talk to your health care provider about any symptoms or concerns you have about testicular or prostate cancer. Skin Cancer  Check your skin from head to toe regularly.  Tell your health care provider about any new moles or changes in moles, especially if:  There is a change in a mole's size, shape, or color.  You have a mole that is larger than a pencil eraser.  Always use sunscreen. Apply sunscreen liberally and repeat throughout the day.  Protect yourself by wearing long sleeves, pants, a wide-brimmed hat, and sunglasses when outside. What should I know about heart disease, diabetes, and high blood pressure?  If you are 18-39 years of age, have your blood pressure checked every 3-5 years. If you are 40 years of age or older, have your blood pressure checked every year. You should have your blood pressure measured twice-once when you are at a hospital or clinic, and once when you are not at   a hospital or clinic. Record the average of the two measurements. To check your blood pressure when you are not at a hospital or clinic, you can use:  An automated blood pressure machine at a pharmacy.  A home blood pressure monitor.  Talk to your health care provider about your target blood pressure.  If you are between 45-79 years old, ask your health care provider if you should take aspirin to prevent heart disease.  Have regular diabetes screenings by checking your fasting blood sugar  level.  If you are at a normal weight and have a low risk for diabetes, have this test once every three years after the age of 45.  If you are overweight and have a high risk for diabetes, consider being tested at a younger age or more often.  A one-time screening for abdominal aortic aneurysm (AAA) by ultrasound is recommended for men aged 65-75 years who are current or former smokers. What should I know about preventing infection? Hepatitis B  If you have a higher risk for hepatitis B, you should be screened for this virus. Talk with your health care provider to find out if you are at risk for hepatitis B infection. Hepatitis C  Blood testing is recommended for:  Everyone born from 1945 through 1965.  Anyone with known risk factors for hepatitis C. Sexually Transmitted Diseases (STDs)  You should be screened each year for STDs including gonorrhea and chlamydia if:  You are sexually active and are younger than 36 years of age.  You are older than 36 years of age and your health care provider tells you that you are at risk for this type of infection.  Your sexual activity has changed since you were last screened and you are at an increased risk for chlamydia or gonorrhea. Ask your health care provider if you are at risk.  Talk with your health care provider about whether you are at high risk of being infected with HIV. Your health care provider may recommend a prescription medicine to help prevent HIV infection. What else can I do?  Schedule regular health, dental, and eye exams.  Stay current with your vaccines (immunizations).  Do not use any tobacco products, such as cigarettes, chewing tobacco, and e-cigarettes. If you need help quitting, ask your health care provider.  Limit alcohol intake to no more than 2 drinks per day. One drink equals 12 ounces of beer, 5 ounces of wine, or 1 ounces of hard liquor.  Do not use street drugs.  Do not share needles.  Ask your health  care provider for help if you need support or information about quitting drugs.  Tell your health care provider if you often feel depressed.  Tell your health care provider if you have ever been abused or do not feel safe at home. This information is not intended to replace advice given to you by your health care provider. Make sure you discuss any questions you have with your health care provider. Document Released: 09/27/2007 Document Revised: 11/28/2015 Document Reviewed: 01/02/2015 Elsevier Interactive Patient Education  2017 [employer]  

## 2016-07-11 LAB — TESTOSTERONE TOTAL,FREE,BIO, MALES
Albumin: 4.7 g/dL (ref 3.6–5.1)
SEX HORMONE BINDING: 15 nmol/L (ref 10–50)
Testosterone, Bioavailable: 153.2 ng/dL (ref 110.0–575.0)
Testosterone, Free: 71.5 pg/mL (ref 46.0–224.0)
Testosterone: 325 ng/dL (ref 250–827)

## 2016-07-12 ENCOUNTER — Encounter: Payer: Self-pay | Admitting: Internal Medicine

## 2016-07-16 ENCOUNTER — Telehealth: Payer: Self-pay | Admitting: Internal Medicine

## 2016-07-16 NOTE — Telephone Encounter (Signed)
Patient is requesting a different medication sent to his pharmacy in place of the levitra.  Insurance does not cover this medication.  Patient uses [facility] on [address].

## 2016-07-16 NOTE — Telephone Encounter (Signed)
Patient has called back.  Would like this sent in if possible.  Patient wanted to know if [facility] is just cheaper than his pharmacy.  I told him that it probably was.  Please follow up with patient once sent in.  Patient does want to keep something around the same mg.

## 2016-07-16 NOTE — Telephone Encounter (Signed)
LVM for pt to call back as soon as possible.   RE: Would like to ask pt if he is interested generic viagra sent to [facility]?

## 2016-07-17 ENCOUNTER — Other Ambulatory Visit: Payer: Self-pay | Admitting: Internal Medicine

## 2016-07-17 DIAGNOSIS — N521 Erectile dysfunction due to diseases classified elsewhere: Secondary | ICD-10-CM

## 2016-07-17 MED ORDER — SILDENAFIL CITRATE 20 MG PO TABS: 80.0000 mg | ORAL_TABLET | Freq: Every day | ORAL | 11 refills | Status: DC | PRN

## 2016-07-17 NOTE — Telephone Encounter (Signed)
RX sent

## 2016-07-17 NOTE — Telephone Encounter (Signed)
Left message with some details about rx being sent.

## 2017-02-04 ENCOUNTER — Encounter (HOSPITAL_COMMUNITY): Payer: Self-pay | Admitting: Emergency Medicine

## 2017-02-04 ENCOUNTER — Emergency Department (HOSPITAL_COMMUNITY): Payer: 59

## 2017-02-04 ENCOUNTER — Emergency Department (HOSPITAL_COMMUNITY)
Admission: EM | Admit: 2017-02-04 | Discharge: 2017-02-04 | Disposition: A | Discharge status: Home / Self Care | Payer: 59 | Attending: Emergency Medicine | Admitting: Emergency Medicine

## 2017-02-04 DIAGNOSIS — Y9389 Activity, other specified: Secondary | ICD-10-CM | POA: Insufficient documentation

## 2017-02-04 DIAGNOSIS — R1030 Lower abdominal pain, unspecified: Secondary | ICD-10-CM

## 2017-02-04 DIAGNOSIS — Y92410 Unspecified street and highway as the place of occurrence of the external cause: Secondary | ICD-10-CM | POA: Insufficient documentation

## 2017-02-04 DIAGNOSIS — S3992XA Unspecified injury of lower back, initial encounter: Secondary | ICD-10-CM | POA: Diagnosis present

## 2017-02-04 DIAGNOSIS — S0990XA Unspecified injury of head, initial encounter: Secondary | ICD-10-CM | POA: Diagnosis not present

## 2017-02-04 DIAGNOSIS — Y999 Unspecified external cause status: Secondary | ICD-10-CM | POA: Insufficient documentation

## 2017-02-04 DIAGNOSIS — S161XXA Strain of muscle, fascia and tendon at neck level, initial encounter: Secondary | ICD-10-CM | POA: Insufficient documentation

## 2017-02-04 DIAGNOSIS — S39012A Strain of muscle, fascia and tendon of lower back, initial encounter: Secondary | ICD-10-CM | POA: Diagnosis not present

## 2017-02-04 LAB — BASIC METABOLIC PANEL
Anion gap: 11 (ref 5–15)
BUN: 14 mg/dL (ref 6–20)
CALCIUM: 9 mg/dL (ref 8.9–10.3)
CO2: 24 mmol/L (ref 22–32)
CREATININE: 0.93 mg/dL (ref 0.61–1.24)
Chloride: 102 mmol/L (ref 101–111)
GFR calc Af Amer: >60 mL/min (ref >60)
Glucose, Bld: 87 mg/dL (ref 65–99)
Potassium: 3.5 mmol/L (ref 3.5–5.1)
SODIUM: 137 mmol/L (ref 135–145)

## 2017-02-04 LAB — CBC WITH DIFFERENTIAL/PLATELET
BASOS ABS: 0 10*3/uL (ref 0.0–0.1)
Basophils Relative: 1 %
EOS ABS: 0.2 10*3/uL (ref 0.0–0.7)
EOS PCT: 3 %
HCT: 43.1 % (ref 39.0–52.0)
Hemoglobin: 14.8 g/dL (ref 13.0–17.0)
Lymphocytes Relative: 31 %
Lymphs Abs: 1.8 10*3/uL (ref 0.7–4.0)
MCH: 31.4 pg (ref 26.0–34.0)
MCHC: 34.3 g/dL (ref 30.0–36.0)
MCV: 91.5 fL (ref 78.0–100.0)
Monocytes Absolute: 0.5 10*3/uL (ref 0.1–1.0)
Monocytes Relative: 9 %
Neutro Abs: 3.2 10*3/uL (ref 1.7–7.7)
Neutrophils Relative %: 56 %
PLATELETS: 140 10*3/uL — AB (ref 150–400)
RBC: 4.71 MIL/uL (ref 4.22–5.81)
RDW: 13.6 % (ref 11.5–15.5)
WBC: 5.8 10*3/uL (ref 4.0–10.5)

## 2017-02-04 MED ORDER — IOPAMIDOL (ISOVUE-300) INJECTION 61%
INTRAVENOUS | Status: AC
  Filled 2017-02-04: qty 100

## 2017-02-04 MED ORDER — SODIUM CHLORIDE 0.9 % IV BOLUS (SEPSIS)
500.0000 mL | Freq: Once | INTRAVENOUS | Status: AC
  Administered 2017-02-04: 500 mL via INTRAVENOUS

## 2017-02-04 MED ORDER — METHOCARBAMOL 500 MG PO TABS: 500.0000 mg | ORAL_TABLET | Freq: Two times a day (BID) | ORAL | 0 refills | Status: DC

## 2017-02-04 MED ORDER — MORPHINE SULFATE (PF) 4 MG/ML IV SOLN
4.0000 mg | Freq: Once | INTRAVENOUS | Status: AC
  Administered 2017-02-04: 4 mg via INTRAVENOUS

## 2017-02-04 MED ORDER — IOPAMIDOL (ISOVUE-300) INJECTION 61%: 100.0000 mL | Freq: Once | INTRAVENOUS | Status: DC | PRN

## 2017-02-04 MED ORDER — TRAMADOL HCL 50 MG PO TABS: 50.0000 mg | ORAL_TABLET | Freq: Four times a day (QID) | ORAL | 0 refills | Status: DC | PRN

## 2017-02-04 MED ORDER — MORPHINE SULFATE (PF) 4 MG/ML IV SOLN
INTRAVENOUS | Status: AC
  Administered 2017-02-04: 4 mg via INTRAVENOUS
  Filled 2017-02-04: qty 1
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
## 2017-02-04 NOTE — Discharge Instructions (Signed)

## 2017-02-06 ENCOUNTER — Encounter: Payer: Self-pay | Admitting: Internal Medicine

## 2017-02-06 ENCOUNTER — Ambulatory Visit (INDEPENDENT_AMBULATORY_CARE_PROVIDER_SITE_OTHER): Payer: 59 | Admitting: Internal Medicine

## 2017-02-06 VITALS — BP 132/86 | HR 98 | Temp 98.5°F | Resp 16 | Wt 187.0 lb

## 2017-02-06 DIAGNOSIS — S060X0A Concussion without loss of consciousness, initial encounter: Secondary | ICD-10-CM | POA: Diagnosis not present

## 2017-02-06 DIAGNOSIS — F4310 Post-traumatic stress disorder, unspecified: Secondary | ICD-10-CM | POA: Diagnosis not present

## 2017-02-06 DIAGNOSIS — M549 Dorsalgia, unspecified: Secondary | ICD-10-CM | POA: Insufficient documentation

## 2017-02-06 MED ORDER — MELOXICAM 15 MG PO TABS: 15.0000 mg | ORAL_TABLET | Freq: Every day | ORAL | 0 refills | Status: DC

## 2017-02-06 MED ORDER — CYCLOBENZAPRINE HCL 10 MG PO TABS: 10.0000 mg | ORAL_TABLET | Freq: Three times a day (TID) | ORAL | 0 refills | Status: DC | PRN

## 2017-02-06 MED ORDER — TRAMADOL HCL 50 MG PO TABS: 50.0000 mg | ORAL_TABLET | Freq: Three times a day (TID) | ORAL | 0 refills | Status: DC | PRN
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
## 2017-02-06 NOTE — Patient Instructions (Addendum)
Stop the methocarbamol.   Start flexeril at night.   Continue the tramadol but try taking three times a day.  Start meloxicam once daily with food.  ( do not take any advil or aleve while taking this ).    Make an appointment with Dr Katrinka BlazingSmith   Here are some therapists that may help -   [facility] [phone] Dr Emerson MonteParrish McKinney [address], [address] [place], [place] [address] [phone]  [facility]  [address] [place], [place] [phone]  [facility]          Does not accept [facility] [address] [place], [place] [phone]  Dr Lawerance Bachupinder Maryan RuedKaur [facility] [address] [place], [place] [address] [phone]  [facility]    - walk ins only for initial viist   M-F  8am - 3pm [address] [place], [place]  [phone] Emerson MonteParrish McKinney [address], [address] [place], [place] [address] [phone]  [facility]  [address] [place], [place] [phone]  [facility]          Does not accept [facility] [address] [place], [place] [phone]  Dr Lawerance Bachupinder Maryan RuedKaur [facility] [address] [place], [place] [address] [phone]  [facility]    - walk ins only for initial viist   M-F  8am - 3pm [address] [place], [place]  [phone] Dr Emerson MonteParrish McKinney [address], [address] [place], [place] [address] [phone]  [facility]  [address] [place], [place] [phone]  [facility]          Does not accept [facility] [address] , [place] [phone]  Dr Lawerance Bachupinder Maryan RuedKaur [facility] [address] [place], [place] [address] [phone]  [facility]    - walk ins only for initial viist   M-F   8am - 3pm [address] [place], [place]  [phone]

## 2017-02-08 DIAGNOSIS — F4310 Post-traumatic stress disorder, unspecified: Secondary | ICD-10-CM

## 2017-02-08 HISTORY — DX: Post-traumatic stress disorder, unspecified: F43.10
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
## 2017-02-08 NOTE — Assessment & Plan Note (Signed)
Symptoms consistent with concussion Will refer to the concussion clinic Okay to take Tylenol as needed for headaches Decreased activity level if symptoms worsen

## 2017-02-09 ENCOUNTER — Ambulatory Visit (INDEPENDENT_AMBULATORY_CARE_PROVIDER_SITE_OTHER)
Admission: RE | Admit: 2017-02-09 | Discharge: 2017-02-09 | Disposition: A | Discharge status: Home / Self Care | Payer: 59 | Source: Ambulatory Visit | Attending: Family Medicine | Admitting: Family Medicine

## 2017-02-09 ENCOUNTER — Ambulatory Visit (INDEPENDENT_AMBULATORY_CARE_PROVIDER_SITE_OTHER): Payer: 59 | Admitting: Family Medicine

## 2017-02-09 ENCOUNTER — Encounter: Payer: Self-pay | Admitting: *Deleted

## 2017-02-09 ENCOUNTER — Encounter: Payer: Self-pay | Admitting: Family Medicine

## 2017-02-09 VITALS — BP 118/80 | HR 93 | Ht 66.0 in | Wt 189.0 lb

## 2017-02-09 DIAGNOSIS — S060X0A Concussion without loss of consciousness, initial encounter: Secondary | ICD-10-CM

## 2017-02-09 DIAGNOSIS — M545 Low back pain, unspecified: Secondary | ICD-10-CM

## 2017-02-09 MED ORDER — VITAMIN D (ERGOCALCIFEROL) 1.25 MG (50000 UNIT) PO CAPS: 50000.0000 [IU] | ORAL_CAPSULE | ORAL | 0 refills | Status: DC

## 2017-02-09 MED ORDER — PREDNISONE 50 MG PO TABS: 50.0000 mg | ORAL_TABLET | Freq: Every day | ORAL | 0 refills | Status: DC
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
## 2017-02-09 NOTE — Patient Instructions (Addendum)
Good to see you  Ice 20 minutes 2 times daily. Usually after activity and before bed. Prednisone daily for 5 days  Stop the meloxicam while you are on it.  Xray downstairs For the concussion   Fish oil 3 grams daily  Once weekly vitamin D for next 12 weeks.  See em again in 1 week.
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
## 2017-02-16 ENCOUNTER — Ambulatory Visit: Payer: 59 | Admitting: Family Medicine

## 2017-02-16 ENCOUNTER — Encounter: Payer: Self-pay | Admitting: Family Medicine

## 2017-02-16 DIAGNOSIS — M545 Low back pain, unspecified: Secondary | ICD-10-CM

## 2017-02-16 DIAGNOSIS — S060X0D Concussion without loss of consciousness, subsequent encounter: Secondary | ICD-10-CM

## 2017-02-16 MED ORDER — GABAPENTIN 100 MG PO CAPS: 200.0000 mg | ORAL_CAPSULE | Freq: Every day | ORAL | 3 refills | Status: DC

## 2017-02-16 MED ORDER — VENLAFAXINE HCL ER 37.5 MG PO CP24: 37.5000 mg | ORAL_CAPSULE | Freq: Every day | ORAL | 1 refills | Status: DC

## 2017-02-16 NOTE — Assessment & Plan Note (Signed)
Condition of the patient is having some difficulty.  We discussed icing regimen and home exercises.  Discussed with patient about the over-the-counter medications and getting greater detail.  Patient will monitor for worsening of any of the headaches.  CT scans reviewed again today by myself and did not see anything significantly.

## 2017-02-16 NOTE — Patient Instructions (Signed)
Good to see you  2 new meds today  Effexor 37.5 mg daily  Gabapentin 200mg  at night We will keep you out another week.  See me again in 1 week and we will hopefully get you back to part time and that you are feeling better

## 2017-02-16 NOTE — Assessment & Plan Note (Signed)
Continues to have tightness.  Patient's x-rays were unremarkable.  Since imaging is warranted without radicular symptoms.  Patient will be started on Effexor as well as gabapentin.  Hopefully this will make an improvement.  We will hold patient out of work for another week and will follow-up at that time.  Patient will likely start part-time work.

## 2017-02-19 DIAGNOSIS — Z0279 Encounter for issue of other medical certificate: Secondary | ICD-10-CM
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
## 2017-02-23 ENCOUNTER — Other Ambulatory Visit (INDEPENDENT_AMBULATORY_CARE_PROVIDER_SITE_OTHER): Payer: 59

## 2017-02-23 ENCOUNTER — Encounter: Payer: Self-pay | Admitting: Family Medicine

## 2017-02-23 ENCOUNTER — Ambulatory Visit: Payer: 59 | Admitting: Family Medicine

## 2017-02-23 VITALS — BP 160/82 | HR 98 | Ht 66.0 in | Wt 190.0 lb

## 2017-02-23 DIAGNOSIS — M545 Low back pain, unspecified: Secondary | ICD-10-CM

## 2017-02-23 DIAGNOSIS — S060X0D Concussion without loss of consciousness, subsequent encounter: Secondary | ICD-10-CM

## 2017-02-23 LAB — COMPREHENSIVE METABOLIC PANEL
ALBUMIN: 4.5 g/dL (ref 3.5–5.2)
ALK PHOS: 84 U/L (ref 39–117)
ALT: 22 U/L (ref 0–53)
AST: 19 U/L (ref 0–37)
BILIRUBIN TOTAL: 0.3 mg/dL (ref 0.2–1.2)
BUN: 11 mg/dL (ref 6–23)
CALCIUM: 9.5 mg/dL (ref 8.4–10.5)
CO2: 23 meq/L (ref 19–32)
CREATININE: 0.81 mg/dL (ref 0.40–1.50)
Chloride: 105 mEq/L (ref 96–112)
GFR: 138.29 mL/min (ref >60.00)
Glucose, Bld: 83 mg/dL (ref 70–99)
Potassium: 3.7 mEq/L (ref 3.5–5.1)
Sodium: 142 mEq/L (ref 135–145)
Total Protein: 7.6 g/dL (ref 6.0–8.3)

## 2017-02-23 LAB — PROTIME-INR
INR: 0.9 ratio (ref 0.8–1.0)
Prothrombin Time: 10.2 s (ref 9.6–13.1)

## 2017-02-23 LAB — CBC WITH DIFFERENTIAL/PLATELET
BASOS PCT: 0.6 % (ref 0.0–3.0)
Basophils Absolute: 0 10*3/uL (ref 0.0–0.1)
Eosinophils Absolute: 0.1 10*3/uL (ref 0.0–0.7)
Eosinophils Relative: 2.1 % (ref 0.0–5.0)
HEMATOCRIT: 44.5 % (ref 39.0–52.0)
Hemoglobin: 14.8 g/dL (ref 13.0–17.0)
LYMPHS PCT: 20.2 % (ref 12.0–46.0)
Lymphs Abs: 1.2 10*3/uL (ref 0.7–4.0)
MCHC: 33.2 g/dL (ref 30.0–36.0)
MCV: 93 fl (ref 78.0–100.0)
MONOS PCT: 11.2 % (ref 3.0–12.0)
Monocytes Absolute: 0.7 10*3/uL (ref 0.1–1.0)
NEUTROS ABS: 3.9 10*3/uL (ref 1.4–7.7)
Neutrophils Relative %: 65.9 % (ref 43.0–77.0)
PLATELETS: 158 10*3/uL (ref 150.0–400.0)
RBC: 4.79 Mil/uL (ref 4.22–5.81)
RDW: 13.9 % (ref 11.5–15.5)
WBC: 5.9 10*3/uL (ref 4.0–10.5)

## 2017-02-23 LAB — VITAMIN D 25 HYDROXY (VIT D DEFICIENCY, FRACTURES): VITD: 27.3 ng/mL — ABNORMAL LOW (ref 30.00–100.00)

## 2017-02-23 LAB — IBC PANEL
IRON: 95 ug/dL (ref 42–165)
SATURATION RATIOS: 24.6 % (ref 20.0–50.0)
TRANSFERRIN: 276 mg/dL (ref 212.0–360.0)

## 2017-02-23 LAB — TSH: TSH: 0.63 u[IU]/mL (ref 0.35–4.50)

## 2017-02-23 LAB — SEDIMENTATION RATE: SED RATE: 16 mm/h — AB (ref 0–15)

## 2017-02-23 LAB — C-REACTIVE PROTEIN: CRP: 0.6 mg/dL (ref 0.5–20.0)

## 2017-02-23 MED ORDER — TRAZODONE HCL 50 MG PO TABS: 25.0000 mg | ORAL_TABLET | Freq: Every evening | ORAL | 3 refills | Status: DC | PRN

## 2017-02-23 NOTE — Assessment & Plan Note (Signed)
Patient continues to have bilateral pain but now is having radicular symptoms going down the right leg.  This is worsening over the course of time and is failing conservative therapy.  Seem to be whiplash injuries at first but now some radiculopathy.  Patient is on Effexor and gabapentin with no significant decrease pain.  I do feel that advanced imaging is warranted.  X-rays have been intermittently visualized by me showing no significant bony ability.  Patient will come back after the MRI and we will discuss further management.  Patient was worsening symptoms to seek medical attention immediately.

## 2017-02-23 NOTE — Assessment & Plan Note (Signed)
Patient is improving at this time.  Very slowly.  I think some of patient's difficulty is secondary to sleep.  Patient is also having difficulty secondary to pain.  Started trazodone and will see if this will be improving.  Follow-up again in 2 weeks

## 2017-02-23 NOTE — Patient Instructions (Signed)
Sorry we do not have an answer We will get labs today  MRI ordered as well  We will get to the bottom of this Also Trazodone at night to help with sleep  Continue the effexor I will write you on the next step

## 2017-02-24 LAB — PTH, INTACT AND CALCIUM
CALCIUM: 9.1 mg/dL (ref 8.6–10.3)
PTH: 45 pg/mL (ref 14–64)

## 2017-02-24 LAB — RHEUMATOID FACTOR

## 2017-02-24 LAB — ANA: Anti Nuclear Antibody(ANA): NEGATIVE

## 2017-02-24 LAB — CALCIUM, IONIZED: Calcium, Ion: 5 mg/dL (ref 4.8–5.6)

## 2017-02-24 LAB — ANGIOTENSIN CONVERTING ENZYME: Angiotensin-Converting Enzyme: 28 U/L (ref 9–67)

## 2017-03-03 ENCOUNTER — Telehealth: Payer: Self-pay | Admitting: Internal Medicine

## 2017-03-03 NOTE — Telephone Encounter (Signed)
I have received disability forms forms [employer] [place] requesting more information for patient during his leave from work beginning 02/05/17

## 2017-03-04 ENCOUNTER — Ambulatory Visit
Admission: RE | Admit: 2017-03-04 | Discharge: 2017-03-04 | Disposition: A | Discharge status: Home / Self Care | Payer: 59 | Source: Ambulatory Visit | Attending: Family Medicine | Admitting: Family Medicine

## 2017-03-04 ENCOUNTER — Encounter: Payer: Self-pay | Admitting: *Deleted

## 2017-03-04 ENCOUNTER — Telehealth: Payer: Self-pay | Admitting: *Deleted

## 2017-03-04 DIAGNOSIS — M545 Low back pain, unspecified: Secondary | ICD-10-CM

## 2017-03-04 DIAGNOSIS — M5416 Radiculopathy, lumbar region: Secondary | ICD-10-CM

## 2017-03-04 NOTE — Telephone Encounter (Signed)
-----   Message from Judi SaaZachary M Smith, DO sent at 03/04/2017  4:16 PM EST ----- Large disc herniation in the back causing the pain Epidural could be very helpful.  We will put in the order and see if it helps.  After the injection I want to see you again 2 weeks after it

## 2017-03-04 NOTE — Telephone Encounter (Signed)
Forms have been completed by Dr. Katrinka BlazingSmith, He has taken the patient out of work until 11/26. Forms have been faxed to [phone], copy sent to scan.

## 2017-03-09 ENCOUNTER — Ambulatory Visit: Payer: 59 | Admitting: Family Medicine

## 2017-03-09 ENCOUNTER — Encounter: Payer: Self-pay | Admitting: Family Medicine

## 2017-03-09 VITALS — BP 140/82 | HR 110 | Ht 66.0 in | Wt 192.0 lb

## 2017-03-09 DIAGNOSIS — M5126 Other intervertebral disc displacement, lumbar region: Secondary | ICD-10-CM | POA: Diagnosis not present

## 2017-03-09 DIAGNOSIS — M5416 Radiculopathy, lumbar region: Secondary | ICD-10-CM | POA: Diagnosis not present

## 2017-03-09 MED ORDER — METHYLPREDNISOLONE ACETATE 80 MG/ML IJ SUSP
80.0000 mg | Freq: Once | INTRAMUSCULAR | Status: AC
  Administered 2017-03-09: 80 mg via INTRAMUSCULAR

## 2017-03-09 MED ORDER — KETOROLAC TROMETHAMINE 60 MG/2ML IM SOLN
60.0000 mg | Freq: Once | INTRAMUSCULAR | Status: AC
  Administered 2017-03-09: 60 mg via INTRAMUSCULAR

## 2017-03-09 MED ORDER — TRAMADOL HCL 50 MG PO TABS: 50.0000 mg | ORAL_TABLET | Freq: Two times a day (BID) | ORAL | 0 refills | Status: DC | PRN

## 2017-03-09 NOTE — Assessment & Plan Note (Signed)
Patient is having lumbar radiculopathy.  Seems to be fairly severe.  Worsening symptoms at this time.  We discussed icing regimen, home exercise, which activities to do which wants to avoid.  Patient will slowly increase activity over the course of the next several days O.  Patient does need the epidural.  Will be sent to neurosurgery for further evaluation at the same time in case surgical intervention at this time.  Patient knows if any worsening symptoms to seek medical attention immediately.  Follow-up with me again in 2 weeks.  Will avoid patient going back to work until next week.

## 2017-03-09 NOTE — Patient Instructions (Signed)
Good to see you  I am sorry you are hurting again  Lets get you in with neurosurgery to discuss I do think the injection could help but we will see how much  Write me after the injections and tell me how you are doing See me again in 2 weeks.
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
## 2017-03-09 NOTE — Progress Notes (Signed)
Tawana ScaleZach Smith D.O. [facility] [address] [place], [place] [address] Phone: [phone] Subjective:    I'm seeing this patient by the request  of:    CC:   BJY:[id number]:Subjective  Mason BrookeByron G Maddox is a 36 y.o. male coming in for follow up for back pain. He bent over to pick up a toy yesterday and said that he immediately had a sharp pain that has not subsided. [facility]. He is using IBU for his pain as he is out of Tramdol and Psychologist, occupationalmuscle relaxors.     No past medical history on file. Past Surgical History:  Procedure Laterality Date  . WISDOM TOOTH EXTRACTION     Social History   Socioeconomic History  . Marital status: Married    Spouse name: Not on file  . Number of children: 2  . Years of education: 116  . Highest education level: Not on file  Social Needs  . Financial resource strain: Not on file  . Food insecurity - worry: Not on file  . Food insecurity - inability: Not on file  . Transportation needs - medical: Not on file  . Transportation needs - non-medical: Not on file  Occupational History  . Occupation: Chartered certified accountantretail Drugstore mgr    Employer: [employer]    Comment: [employer]  Tobacco Use  . Smoking status: Never Smoker  . Smokeless tobacco: Never Used  Substance and Sexual Activity  . Alcohol use: Yes    Alcohol/week: 1.8 oz    Types: 3 Standard drinks or equivalent per week  . Drug use: No  . Sexual activity: Yes    Partners: Female    Birth control/protection: Condom  Other Topics Concern  . Not on file  Social History Narrative   [employer]- BS. Married- '09. 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 Hypertension Mother   . Hypertension Father   . Hypertension Brother   . Alcohol abuse Neg Hx   . Cancer Neg Hx   . Diabetes Neg Hx   . COPD Neg Hx   . Depression Neg Hx   . Drug abuse  Neg Hx   . Early death Neg Hx   . Heart disease Neg Hx   . Hyperlipidemia Neg Hx   . Kidney disease Neg Hx   . Stroke Neg Hx      Past medical history, social, surgical and family history all reviewed in electronic medical record.  No pertanent information unless stated regarding to the chief complaint.   Review of Systems:Review of systems updated and as accurate as of 03/09/17  No headache, visual changes, nausea, vomiting, diarrhea, constipation, dizziness, abdominal pain, skin rash, fevers, chills, night sweats, weight loss, swollen lymph nodes, body aches, joint swelling, muscle aches, chest pain, shortness of breath, mood changes.   Objective  There were no vitals taken for this visit. Systems examined below as of 03/09/17   General: No apparent distress alert and oriented x3 mood and affect normal, dressed appropriately.  HEENT: Pupils equal, extraocular movements intact  Respiratory: Patient's speak in full sentences and does not appear short of breath  Cardiovascular: No lower extremity edema, non tender, no erythema  Skin: Warm dry intact with no signs of infection or rash on extremities or on axial skeleton.  Abdomen: Soft nontender  Neuro: Cranial nerves II  through XII are intact, neurovascularly intact in all extremities with 2+ DTRs and 2+ pulses.  Lymph: No lymphadenopathy of posterior or anterior cervical chain or axillae bilaterally.  Gait normal with good balance and coordination.  MSK:  Non tender with full range of motion and good stability and symmetric strength and tone of shoulders, elbows, wrist, hip, knee and ankles bilaterally.     Impression and Recommendations:     This case required medical decision making of moderate complexity.      Note: This dictation was prepared with Dragon dictation along with smaller phrase technology. Any transcriptional errors that result from this process are unintentional.

## 2017-03-16 ENCOUNTER — Ambulatory Visit
Admission: RE | Admit: 2017-03-16 | Discharge: 2017-03-16 | Disposition: A | Discharge status: Home / Self Care | Payer: 59 | Source: Ambulatory Visit | Attending: Family Medicine | Admitting: Family Medicine

## 2017-03-16 DIAGNOSIS — M5416 Radiculopathy, lumbar region: Secondary | ICD-10-CM

## 2017-03-16 MED ORDER — METHYLPREDNISOLONE ACETATE 40 MG/ML INJ SUSP (RADIOLOG
120.0000 mg | Freq: Once | INTRAMUSCULAR | Status: AC
  Administered 2017-03-16: 120 mg via EPIDURAL

## 2017-03-16 MED ORDER — IOPAMIDOL (ISOVUE-M 200) INJECTION 41%
1.0000 mL | Freq: Once | INTRAMUSCULAR | Status: AC
  Administered 2017-03-16: 1 mL via EPIDURAL

## 2017-03-16 NOTE — Discharge Instructions (Signed)
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
## 2017-04-01 ENCOUNTER — Encounter: Payer: Self-pay | Admitting: Family Medicine

## 2017-04-01 ENCOUNTER — Ambulatory Visit: Payer: 59 | Admitting: Family Medicine

## 2017-04-01 DIAGNOSIS — M5416 Radiculopathy, lumbar region: Secondary | ICD-10-CM

## 2017-04-01 MED ORDER — TRAMADOL HCL 50 MG PO TABS: 50.0000 mg | ORAL_TABLET | Freq: Two times a day (BID) | ORAL | 0 refills | Status: DC | PRN

## 2017-04-01 NOTE — Patient Instructions (Signed)
Sorry it didn't work out MusicianDiscuss with Wynetta Emeryram  Refilled tramadol  Will have you out til you see him  Please contact us with any questions

## 2017-04-01 NOTE — Assessment & Plan Note (Signed)
Patient is having worsening symptoms at this time.  Has lumbar radiculopathy.  MRI is consistent with patient's symptomatology.  Patient did have a epidural and did have improvement 4 hours and approximately 80-90% in the easily.  With the increasing weakness, the difficulty with daily activities and patient unable to work which is his livelihood patient does need the possibility of surgical intervention.  Following up with neurosurgery on the 27th.  Patient will be evaluated for the possibility of a microdiscectomy.  Depending on what they discussed we will talk other treatment options but likely surgical intervention will be necessary.

## 2017-04-03 ENCOUNTER — Telehealth: Payer: Self-pay | Admitting: *Deleted

## 2017-04-03 NOTE — Telephone Encounter (Signed)
Prior-auth for Tramadol 50mg  approved through 6.19.19.

## 2017-04-09 ENCOUNTER — Telehealth: Payer: Self-pay | Admitting: Internal Medicine

## 2017-04-09 NOTE — Telephone Encounter (Signed)
Copied from CRM [id number]. Topic: Quick Communication - See Telephone Encounter >> Apr 09, 2017  3:12 PM Cipriano BunkerLambe, Annette S wrote: CRM for notification. See Telephone encounter for:  Patient called to say he is out of work again for one more week on short term disability, and [employer] needs medical records shows the short term disability so he can get paid.  Fax # [phone]   claim # [id number]   04/09/17.

## 2017-04-10 NOTE — Telephone Encounter (Signed)
Spoke to pt, since we took him out of work again, [employer] is requesting the last OV note stating why he was taken back out of work.  Note faxed.

## 2017-04-13 ENCOUNTER — Other Ambulatory Visit: Payer: Self-pay | Admitting: Neurosurgery

## 2017-04-13 DIAGNOSIS — M5126 Other intervertebral disc displacement, lumbar region: Secondary | ICD-10-CM

## 2017-04-15 ENCOUNTER — Telehealth: Payer: Self-pay | Admitting: Internal Medicine

## 2017-04-15 NOTE — Telephone Encounter (Signed)
Copied from CRM [id number]. Topic: Referral - Question >> Apr 15, 2017 11:30 AM Gerrianne ScalePayne, Angela L wrote: Reason for CRM: Dustin FlockShatanna from [facility] [address] [place], [place] [address] [phone] ext [phone]  Calling stating that pt gave them a [facility] but in referral it said that he has [employer]uerbach[employer]igna so [facility] need a referral from primary Doctor to referr him there and they need authorization number for pt OV at [facility] on 04-09-17 so that it would be covered please call Shatanna at [phone] ext [phone]

## 2017-04-16 NOTE — Telephone Encounter (Signed)
Referral [facility] from [facility] and BraxtonShatanna from [facility] has been contacted

## 2017-04-17 ENCOUNTER — Other Ambulatory Visit: Payer: Self-pay | Admitting: Family Medicine

## 2017-04-23 ENCOUNTER — Ambulatory Visit
Admission: RE | Admit: 2017-04-23 | Discharge: 2017-04-23 | Disposition: A | Discharge status: Home / Self Care | Payer: 59 | Source: Ambulatory Visit | Attending: Neurosurgery | Admitting: Neurosurgery

## 2017-04-23 ENCOUNTER — Other Ambulatory Visit: Payer: 59

## 2017-04-23 DIAGNOSIS — M5126 Other intervertebral disc displacement, lumbar region: Secondary | ICD-10-CM

## 2017-04-23 MED ORDER — METHYLPREDNISOLONE ACETATE 40 MG/ML INJ SUSP (RADIOLOG
120.0000 mg | Freq: Once | INTRAMUSCULAR | Status: AC
  Administered 2017-04-23: 120 mg via EPIDURAL

## 2017-04-23 MED ORDER — IOPAMIDOL (ISOVUE-M 200) INJECTION 41%
1.0000 mL | Freq: Once | INTRAMUSCULAR | Status: AC
  Administered 2017-04-23: 1 mL via EPIDURAL

## 2017-04-23 NOTE — Discharge Instructions (Signed)

## 2017-04-28 ENCOUNTER — Other Ambulatory Visit: Payer: Self-pay | Admitting: Internal Medicine

## 2017-04-30 ENCOUNTER — Telehealth: Payer: Self-pay

## 2017-04-30 NOTE — Telephone Encounter (Signed)
Refill approved by Dr. Yetta BarreJones and scripts sent in.

## 2017-04-30 NOTE — Telephone Encounter (Signed)
Please advise regarding refill requests.  Thanks

## 2017-04-30 NOTE — Telephone Encounter (Signed)
Refill approved by Dr.Jones.

## 2017-05-05 ENCOUNTER — Other Ambulatory Visit: Payer: Self-pay | Admitting: Family Medicine

## 2017-05-05 NOTE — Telephone Encounter (Signed)
Refill done.  

## 2017-06-03 ENCOUNTER — Other Ambulatory Visit: Payer: Self-pay | Admitting: Family Medicine

## 2017-11-03 ENCOUNTER — Other Ambulatory Visit: Payer: Self-pay | Admitting: Internal Medicine

## 2017-11-03 DIAGNOSIS — N521 Erectile dysfunction due to diseases classified elsewhere: Secondary | ICD-10-CM
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
## 2018-11-08 ENCOUNTER — Encounter: Payer: Self-pay | Admitting: Internal Medicine

## 2018-11-08 ENCOUNTER — Other Ambulatory Visit (INDEPENDENT_AMBULATORY_CARE_PROVIDER_SITE_OTHER): Payer: 59

## 2018-11-08 ENCOUNTER — Ambulatory Visit (INDEPENDENT_AMBULATORY_CARE_PROVIDER_SITE_OTHER): Payer: 59 | Admitting: Internal Medicine

## 2018-11-08 ENCOUNTER — Other Ambulatory Visit: Payer: Self-pay

## 2018-11-08 VITALS — BP 140/94 | HR 88 | Temp 98.0°F | Resp 16 | Ht 66.0 in | Wt 200.0 lb

## 2018-11-08 DIAGNOSIS — I10 Essential (primary) hypertension: Secondary | ICD-10-CM

## 2018-11-08 DIAGNOSIS — Z0000 Encounter for general adult medical examination without abnormal findings: Secondary | ICD-10-CM | POA: Diagnosis not present

## 2018-11-08 DIAGNOSIS — D696 Thrombocytopenia, unspecified: Secondary | ICD-10-CM | POA: Diagnosis not present

## 2018-11-08 DIAGNOSIS — E559 Vitamin D deficiency, unspecified: Secondary | ICD-10-CM

## 2018-11-08 HISTORY — DX: Essential (primary) hypertension: I10

## 2018-11-08 HISTORY — DX: Vitamin D deficiency, unspecified: E55.9

## 2018-11-08 LAB — CBC WITH DIFFERENTIAL/PLATELET
Basophils Absolute: 0 10*3/uL (ref 0.0–0.1)
Basophils Relative: 1 % (ref 0.0–3.0)
Eosinophils Absolute: 0.2 10*3/uL (ref 0.0–0.7)
Eosinophils Relative: 5.6 % — ABNORMAL HIGH (ref 0.0–5.0)
HCT: 45 % (ref 39.0–52.0)
Hemoglobin: 15 g/dL (ref 13.0–17.0)
Lymphocytes Relative: 35.2 % (ref 12.0–46.0)
Lymphs Abs: 1.2 10*3/uL (ref 0.7–4.0)
MCHC: 33.3 g/dL (ref 30.0–36.0)
MCV: 91.6 fl (ref 78.0–100.0)
Monocytes Absolute: 0.4 10*3/uL (ref 0.1–1.0)
Monocytes Relative: 10.8 % (ref 3.0–12.0)
Neutro Abs: 1.7 10*3/uL (ref 1.4–7.7)
Neutrophils Relative %: 47.4 % (ref 43.0–77.0)
Platelets: 146 10*3/uL — ABNORMAL LOW (ref 150.0–400.0)
RBC: 4.92 Mil/uL (ref 4.22–5.81)
RDW: 13.8 % (ref 11.5–15.5)
WBC: 3.5 10*3/uL — ABNORMAL LOW (ref 4.0–10.5)

## 2018-11-08 LAB — HEPATIC FUNCTION PANEL
ALT: 43 U/L (ref 0–53)
AST: 26 U/L (ref 0–37)
Albumin: 4.6 g/dL (ref 3.5–5.2)
Alkaline Phosphatase: 91 U/L (ref 39–117)
Bilirubin, Direct: 0.1 mg/dL (ref 0.0–0.3)
Total Bilirubin: 0.4 mg/dL (ref 0.2–1.2)
Total Protein: 7.7 g/dL (ref 6.0–8.3)

## 2018-11-08 LAB — URINALYSIS, ROUTINE W REFLEX MICROSCOPIC
Bilirubin Urine: NEGATIVE
Hgb urine dipstick: NEGATIVE
Ketones, ur: NEGATIVE
Leukocytes,Ua: NEGATIVE
Nitrite: NEGATIVE
RBC / HPF: NONE SEEN (ref 0–?)
Specific Gravity, Urine: 1.02 (ref 1.000–1.030)
Total Protein, Urine: NEGATIVE
Urine Glucose: NEGATIVE
Urobilinogen, UA: 0.2 (ref 0.0–1.0)
pH: 6 (ref 5.0–8.0)

## 2018-11-08 LAB — LIPID PANEL
Cholesterol: 210 mg/dL — ABNORMAL HIGH (ref 0–200)
HDL: 94.7 mg/dL (ref >39.00)
LDL Cholesterol: 92 mg/dL (ref 0–99)
NonHDL: 115.24
Total CHOL/HDL Ratio: 2
Triglycerides: 117 mg/dL (ref 0.0–149.0)
VLDL: 23.4 mg/dL (ref 0.0–40.0)

## 2018-11-08 LAB — BASIC METABOLIC PANEL
BUN: 10 mg/dL (ref 6–23)
CO2: 26 mEq/L (ref 19–32)
Calcium: 9.2 mg/dL (ref 8.4–10.5)
Chloride: 104 mEq/L (ref 96–112)
Creatinine, Ser: 0.9 mg/dL (ref 0.40–1.50)
GFR: 114.15 mL/min (ref >60.00)
Glucose, Bld: 82 mg/dL (ref 70–99)
Potassium: 4.2 mEq/L (ref 3.5–5.1)
Sodium: 140 mEq/L (ref 135–145)

## 2018-11-08 LAB — VITAMIN D 25 HYDROXY (VIT D DEFICIENCY, FRACTURES): VITD: 18.81 ng/mL — ABNORMAL LOW (ref 30.00–100.00)

## 2018-11-08 LAB — TSH: TSH: 0.8 u[IU]/mL (ref 0.35–4.50)

## 2018-11-08 MED ORDER — NEBIVOLOL HCL 5 MG PO TABS: 5.0000 mg | ORAL_TABLET | Freq: Every day | ORAL | 1 refills | Status: DC

## 2018-11-08 MED ORDER — VITAMIN D (ERGOCALCIFEROL) 1.25 MG (50000 UNIT) PO CAPS: 50000.0000 [IU] | ORAL_CAPSULE | ORAL | 0 refills | Status: DC

## 2018-11-08 NOTE — Patient Instructions (Signed)

## 2018-11-08 NOTE — Progress Notes (Signed)
Subjective:  Patient ID: Mason Maddox, male    DOB: [birth year]  Age: 38 y.o. MRN: [id number]  CC: Annual Exam and Hypertension   HPI RAINEY KAHRS presents for a CPX.  He requests that I write him a note to keep him out of work for a month.  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  Mr. Koontz has a daughter at home who has asthma so he does not want to expose her to COVID-19.  He says the stress of being at work is raising his blood pressure.  He denies any recent episodes of CP, DOE, palpitations, edema, fatigue, headache, blurred vision.  He complains of weight gain.  Outpatient Medications Prior to Visit  Medication Sig Dispense Refill  . cyclobenzaprine (FLEXERIL) 10 MG tablet TAKE 1 TABLET(10 MG) BY MOUTH THREE TIMES DAILY AS NEEDED FOR MUSCLE SPASMS (Patient not taking: Reported on 11/08/2018) 30 tablet 0  . gabapentin (NEURONTIN) 100 MG capsule Take 2 capsules (200 mg total) at bedtime by mouth. (Patient not taking: Reported on 11/08/2018) 60 capsule 3  . meloxicam (MOBIC) 15 MG tablet TAKE 1 TABLET(15 MG) BY MOUTH DAILY (Patient not taking: Reported on 11/08/2018) 30 tablet 0  . sildenafil (REVATIO) 20 MG tablet TAKE 4 TABLETS BY MOUTH ONCE DAILY AS NEEDED FOR SEXUAL ACTIVITY (Patient not taking: Reported on 11/08/2018) 60 tablet 11  . traMADol (ULTRAM) 50 MG tablet Take 1 tablet (50 mg total) by mouth every 12 (twelve) hours as needed. (Patient not taking: Reported on 11/08/2018) 60 tablet 0  . traZODone (DESYREL) 50 MG tablet Take 0.5-1 tablets (25-50 mg total) at bedtime as needed by mouth for sleep. (Patient not taking: Reported on 11/08/2018) 30 tablet 3  . venlafaxine XR (EFFEXOR-XR) 37.5 MG 24 hr capsule TAKE 1 CAPSULE(37.5 MG) BY MOUTH DAILY WITH BREAKFAST (Patient not taking: Reported on 11/08/2018) 90 capsule 1  . Vitamin D, Ergocalciferol, (DRISDOL) 50000 units CAPS capsule TAKE 1 CAPSULE BY MOUTH EVERY 7 DAYS (Patient not  taking: Reported on 11/08/2018) 12 capsule 0   No facility-administered medications prior to visit.     ROS Review of Systems  Constitutional: Positive for unexpected weight change (wt gain). Negative for diaphoresis and fatigue.  HENT: Negative.   Eyes: Negative.   Respiratory: Negative.  Negative for cough, chest tightness, shortness of breath and wheezing.   Cardiovascular: Negative for chest pain, palpitations and leg swelling.  Gastrointestinal: Negative for abdominal pain, constipation, diarrhea and vomiting.  Endocrine: Negative.   Genitourinary: Negative.  Negative for difficulty urinating, penile swelling, scrotal swelling and testicular pain.  Musculoskeletal: Negative.   Skin: Negative.   Neurological: Negative.  Negative for dizziness, weakness, light-headedness and headaches.  Hematological: Negative for adenopathy. Does not bruise/bleed easily.  Psychiatric/Behavioral: Negative for behavioral problems, decreased concentration, dysphoric mood, sleep disturbance and suicidal ideas. The patient is nervous/anxious.     Objective:  BP (!) 140/94 (BP Location: Left Arm, Patient Position: Sitting, Cuff Size: Large)   Pulse 88   Temp 98 F (36.7 C) (Oral)   Resp 16   Ht 5\' 6"  (1.676 m)   Wt 200 lb (90.7 kg)   SpO2 96%   BMI 32.28 kg/m   BP Readings from Last 3 Encounters:  11/08/18 (!) 140/94  04/23/17 (!) 157/85  04/01/17 118/90    Wt Readings from Last 3 Encounters:  11/08/18 200 lb (90.7 kg)  04/01/17 191 lb (86.6 kg)  03/09/17  192 lb (87.1 kg)    Physical Exam Vitals signs reviewed.  Constitutional:      Appearance: He is obese.  HENT:     Nose: Nose normal. No congestion.     Mouth/Throat:     Mouth: Mucous membranes are moist.  Eyes:     General: No scleral icterus.    Conjunctiva/sclera: Conjunctivae normal.  Neck:     Musculoskeletal: Normal range of motion. No neck rigidity or muscular tenderness.  Cardiovascular:     Rate and Rhythm: Normal  rate and regular rhythm.     Heart sounds: No murmur.     Comments: EKG ----  Sinus  Rhythm  -  Nonspecific T-abnormality.   ABNORMAL   Pulmonary:     Effort: Pulmonary effort is normal. No respiratory distress.     Breath sounds: No stridor. No wheezing, rhonchi or rales.  Abdominal:     General: Abdomen is protuberant. Bowel sounds are normal. There is no distension.     Palpations: Abdomen is soft. There is no hepatomegaly or splenomegaly.     Tenderness: There is no abdominal tenderness.  Musculoskeletal: Normal range of motion.     Right lower leg: No edema.     Left lower leg: No edema.  Lymphadenopathy:     Cervical: No cervical adenopathy.  Skin:    General: Skin is warm and dry.     Findings: No rash.  Neurological:     General: No focal deficit present.     Mental Status: He is alert and oriented to person, place, and time. Mental status is at baseline.  Psychiatric:        Attention and Perception: Attention and perception normal.        Mood and Affect: Affect normal. Mood is anxious.        Speech: Speech normal.        Behavior: Behavior normal. Behavior is cooperative.        Thought Content: Thought content normal.        Cognition and Memory: Cognition normal.        Judgment: Judgment normal.     Lab Results  Component Value Date   WBC 3.5 (L) 11/08/2018   HGB 15.0 11/08/2018   HCT 45.0 11/08/2018   PLT 146.0 (L) 11/08/2018   GLUCOSE 82 11/08/2018   CHOL 210 (H) 11/08/2018   TRIG 117.0 11/08/2018   HDL 94.70 11/08/2018   LDLCALC 92 11/08/2018   ALT 43 11/08/2018   AST 26 11/08/2018   NA 140 11/08/2018   K 4.2 11/08/2018   CL 104 11/08/2018   CREATININE 0.90 11/08/2018   BUN 10 11/08/2018   CO2 26 11/08/2018   TSH 0.80 11/08/2018   PSA 0.52 07/10/2016   INR 0.9 02/23/2017   HGBA1C 5.0 07/10/2016    Dg Inject Diag/thera/inc Needle/cath/plc Epi/lumb/sac W/img  Result Date: 04/23/2017 CLINICAL DATA:  Spondylosis without myelopathy.  Largest disc herniation L5-S1. Low back pain. No radicular pain described. Improvement from the initial injection but with recurrence of symptoms after 1 week. FLUOROSCOPY TIME:  0 minutes 21 seconds. 24.36 micro gray meter squared EXAM: CAUDAL EPIDURAL INJECTION Utilizing a caudal approach, the skin overlying the sacral hiatus was cleansed and anesthetized. A 22 gauge epidural needle was advanced into the sacral epidural space. Injection of Isovue-M 200 shows a good epidural pattern with spread up to L5-S1. No vascular opacification is seen. 120 mg of Depo-Medrol mixed with 3 ml of normal saline and  3 ml of 1% Lidocaine were instilled. The procedure was well-tolerated, and the patient was discharged thirty minutes following the injection in good condition. IMPRESSION: Technically successful caudal epidural injection #2. Electronically Signed   By: Paulina FusiMark  Shogry M.D.   On: 04/23/2017 09:20    Assessment & Plan:   Ortencia KickByron was seen today for annual exam and hypertension.  Diagnoses and all orders for this visit:  Routine general medical examination at a health care facility- Exam completed, labs reviewed, vaccines reviewed, patient education material was given. -     Lipid panel; Future -     HIV Antibody (routine testing w rflx); Future  Vitamin D deficiency disease- I have asked him to start treating the vitamin D deficiency. -     VITAMIN D 25 Hydroxy (Vit-D Deficiency, Fractures); Future -     Vitamin D, Ergocalciferol, (DRISDOL) 1.25 MG (50000 UT) CAPS capsule; Take 1 capsule (50,000 Units total) by mouth every 7 (seven) days.  Essential hypertension- His blood pressure is not adequately well controlled.  His EKG is negative for LVH or ischemia.  I will treat the vitamin D deficiency.  Otherwise, his labs are negative for secondary causes or endorgan damage.  I have asked him to start treating this with nebivolol. -     CBC with Differential/Platelet; Future -     Basic metabolic panel; Future -      TSH; Future -     Urinalysis, Routine w reflex microscopic; Future -     Hepatic function panel; Future -     EKG 12-Lead -     nebivolol (BYSTOLIC) 5 MG tablet; Take 1 tablet (5 mg total) by mouth daily.  Thrombocytopenia (HCC)- I have asked him to come back in to be screened for B12 and folate deficiencies. -     Folate; Future -     Vitamin B12; Future   I have discontinued Burr MedicoByron G. Montminy's gabapentin, traZODone, traMADol, venlafaxine XR, meloxicam, cyclobenzaprine, and sildenafil. I have also changed his Vitamin D (Ergocalciferol). Additionally, I am having him start on nebivolol.  Meds ordered this encounter  Medications  . nebivolol (BYSTOLIC) 5 MG tablet    Sig: Take 1 tablet (5 mg total) by mouth daily.    Dispense:  90 tablet    Refill:  1  . Vitamin D, Ergocalciferol, (DRISDOL) 1.25 MG (50000 UT) CAPS capsule    Sig: Take 1 capsule (50,000 Units total) by mouth every 7 (seven) days.    Dispense:  12 capsule    Refill:  0     Follow-up: Return in about 3 months (around 02/08/2019).  Sanda Lingerhomas Zoelle Markus, MD

## 2018-11-09 LAB — HIV ANTIBODY (ROUTINE TESTING W REFLEX): HIV 1&2 Ab, 4th Generation: NONREACTIVE

## 2019-01-10 ENCOUNTER — Telehealth: Payer: Self-pay | Admitting: Internal Medicine

## 2019-01-10 DIAGNOSIS — M25511 Pain in right shoulder: Secondary | ICD-10-CM

## 2019-01-10 NOTE — Telephone Encounter (Signed)
Referral was sent for the wrong doctor can this be changed please?   Was placed Dr. Tamera Punt and need it changed Dr. Melrose Nakayama NPI- [id number]  Patient's appointment is today. CB- [phone]

## 2019-01-10 NOTE — Telephone Encounter (Signed)
Referral entered. Forwarding to lori to see if she can fax this over this morning.

## 2019-01-10 NOTE — Telephone Encounter (Signed)
Pt informed that referral is being resent.

## 2019-01-10 NOTE — Telephone Encounter (Signed)
Pt called and is requesting an update. Please advise.  °

## 2019-01-10 NOTE — Telephone Encounter (Signed)
Patient requesting referral to [facility] due to right shoulder pain, patient has an appointment today this morning.

## 2019-04-21 ENCOUNTER — Other Ambulatory Visit: Payer: Self-pay

## 2019-04-21 ENCOUNTER — Ambulatory Visit (HOSPITAL_COMMUNITY)
Admission: EM | Admit: 2019-04-21 | Discharge: 2019-04-21 | Disposition: A | Discharge status: Home / Self Care | Payer: Self-pay | Attending: Family Medicine | Admitting: Family Medicine

## 2019-04-21 ENCOUNTER — Encounter (HOSPITAL_COMMUNITY): Payer: Self-pay

## 2019-04-21 DIAGNOSIS — S161XXA Strain of muscle, fascia and tendon at neck level, initial encounter: Secondary | ICD-10-CM

## 2019-04-21 MED ORDER — CYCLOBENZAPRINE HCL 10 MG PO TABS: ORAL_TABLET | ORAL | 0 refills | Status: DC

## 2019-04-21 MED ORDER — DICLOFENAC SODIUM 75 MG PO TBEC: 75.0000 mg | DELAYED_RELEASE_TABLET | Freq: Two times a day (BID) | ORAL | 0 refills | Status: DC
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
## 2019-06-07 ENCOUNTER — Other Ambulatory Visit: Payer: Self-pay | Admitting: Internal Medicine

## 2019-06-07 DIAGNOSIS — I10 Essential (primary) hypertension: Secondary | ICD-10-CM

## 2020-03-01 ENCOUNTER — Other Ambulatory Visit: Payer: Self-pay | Admitting: Internal Medicine

## 2020-03-01 DIAGNOSIS — I10 Essential (primary) hypertension: Secondary | ICD-10-CM

## 2020-03-01 MED ORDER — NEBIVOLOL HCL 5 MG PO TABS: ORAL_TABLET | ORAL | 0 refills | Status: DC

## 2020-06-06 ENCOUNTER — Other Ambulatory Visit: Payer: Self-pay | Admitting: Internal Medicine

## 2020-06-06 DIAGNOSIS — I10 Essential (primary) hypertension: Secondary | ICD-10-CM

## 2020-07-16 ENCOUNTER — Telehealth (INDEPENDENT_AMBULATORY_CARE_PROVIDER_SITE_OTHER): Payer: Self-pay | Admitting: Internal Medicine

## 2020-07-16 DIAGNOSIS — J309 Allergic rhinitis, unspecified: Secondary | ICD-10-CM

## 2020-07-16 DIAGNOSIS — J069 Acute upper respiratory infection, unspecified: Secondary | ICD-10-CM

## 2020-07-16 MED ORDER — AZITHROMYCIN 250 MG PO TABS: ORAL_TABLET | ORAL | 0 refills | Status: DC

## 2020-07-16 MED ORDER — PREDNISONE 10 MG PO TABS: ORAL_TABLET | ORAL | 0 refills | Status: DC

## 2020-07-16 NOTE — Progress Notes (Signed)
Patient ID: Mason Maddox, male   DOB: 1980/08/13, 40 y.o.   MRN: [id number]  Cumulative time during 7-day interval 13 min, there was not an associated office visit for this concern within a 7 day period.  Verbal consent for services obtained from patient prior to services given.  Names of all persons present for services: Oliver Barre, MD, patient  Chief complaint: URI and allergies  History, background, results pertinent:   Here with 2-3 days acute onset fever, facial pain, pressure, headache, general weakness and malaise, and greenish d/c, with mild ST and cough, but pt denies chest pain, wheezing, increased sob or doe, orthopnea, PND, increased LE swelling, palpitations, dizziness or syncope.  Does have several wks ongoing nasal allergy symptoms with clearish congestion, itch and sneezing, without fever, pain, ST, cough, swelling or wheezing  Past Medical History:  Diagnosis Date  . Essential hypertension 11/08/2018  . PTSD (post-traumatic stress disorder) 02/08/2017  . Vitamin D deficiency disease 11/08/2018   No results found for this or any previous visit (from the past 48 hour(s)).   A/P/next steps:   1)  URI - Mild to mod, for antibx course,  to f/u any worsening symptoms or concerns  2)  Allergic rhinitis - for predpac asd, and nasacort,  to f/u any worsening symptoms or concerns  Oliver Barre MD

## 2020-07-21 ENCOUNTER — Encounter: Payer: Self-pay | Admitting: Internal Medicine

## 2020-07-21 DIAGNOSIS — J309 Allergic rhinitis, unspecified: Secondary | ICD-10-CM | POA: Insufficient documentation

## 2020-07-21 DIAGNOSIS — J069 Acute upper respiratory infection, unspecified: Secondary | ICD-10-CM | POA: Insufficient documentation

## 2020-07-21 NOTE — Patient Instructions (Signed)
Please take all new medication as prescribed 

## 2022-01-08 ENCOUNTER — Ambulatory Visit: Payer: Commercial Managed Care - HMO | Admitting: Psychology
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
## 2022-02-03 ENCOUNTER — Ambulatory Visit (INDEPENDENT_AMBULATORY_CARE_PROVIDER_SITE_OTHER): Payer: Commercial Managed Care - HMO | Admitting: Psychology

## 2022-02-03 DIAGNOSIS — F4321 Adjustment disorder with depressed mood: Secondary | ICD-10-CM
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
## 2022-02-17 ENCOUNTER — Ambulatory Visit: Payer: Commercial Managed Care - HMO | Admitting: Psychology

## 2022-02-17 NOTE — Progress Notes (Incomplete)
                Ahijah Devery LEWIS, PhD 

## 2022-03-03 ENCOUNTER — Ambulatory Visit: Payer: Commercial Managed Care - HMO | Admitting: Psychology
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
## 2022-05-08 ENCOUNTER — Encounter: Payer: Self-pay | Admitting: Internal Medicine

## 2022-05-08 ENCOUNTER — Ambulatory Visit (INDEPENDENT_AMBULATORY_CARE_PROVIDER_SITE_OTHER): Payer: Self-pay | Admitting: Internal Medicine

## 2022-05-08 ENCOUNTER — Ambulatory Visit (INDEPENDENT_AMBULATORY_CARE_PROVIDER_SITE_OTHER): Payer: Self-pay

## 2022-05-08 VITALS — BP 148/100 | HR 93 | Temp 98.4°F | Ht 66.0 in | Wt 220.0 lb

## 2022-05-08 DIAGNOSIS — M5412 Radiculopathy, cervical region: Secondary | ICD-10-CM

## 2022-05-08 DIAGNOSIS — Z1159 Encounter for screening for other viral diseases: Secondary | ICD-10-CM | POA: Insufficient documentation

## 2022-05-08 DIAGNOSIS — I10 Essential (primary) hypertension: Secondary | ICD-10-CM

## 2022-05-08 DIAGNOSIS — Z0001 Encounter for general adult medical examination with abnormal findings: Secondary | ICD-10-CM

## 2022-05-08 DIAGNOSIS — R9431 Abnormal electrocardiogram [ECG] [EKG]: Secondary | ICD-10-CM | POA: Insufficient documentation

## 2022-05-08 DIAGNOSIS — R0609 Other forms of dyspnea: Secondary | ICD-10-CM | POA: Insufficient documentation

## 2022-05-08 DIAGNOSIS — Z0000 Encounter for general adult medical examination without abnormal findings: Secondary | ICD-10-CM

## 2022-05-08 DIAGNOSIS — M5030 Other cervical disc degeneration, unspecified cervical region: Secondary | ICD-10-CM

## 2022-05-08 LAB — HEPATIC FUNCTION PANEL
ALT: 42 U/L (ref 0–53)
AST: 28 U/L (ref 0–37)
Albumin: 4.6 g/dL (ref 3.5–5.2)
Alkaline Phosphatase: 79 U/L (ref 39–117)
Bilirubin, Direct: 0.1 mg/dL (ref 0.0–0.3)
Total Bilirubin: 0.4 mg/dL (ref 0.2–1.2)
Total Protein: 7.6 g/dL (ref 6.0–8.3)

## 2022-05-08 LAB — CBC WITH DIFFERENTIAL/PLATELET
Basophils Absolute: 0 10*3/uL (ref 0.0–0.1)
Basophils Relative: 0.5 % (ref 0.0–3.0)
Eosinophils Absolute: 0.2 10*3/uL (ref 0.0–0.7)
Eosinophils Relative: 4.4 % (ref 0.0–5.0)
HCT: 51.5 % (ref 39.0–52.0)
Hemoglobin: 17.3 g/dL — ABNORMAL HIGH (ref 13.0–17.0)
Lymphocytes Relative: 28.3 % (ref 12.0–46.0)
Lymphs Abs: 1.4 10*3/uL (ref 0.7–4.0)
MCHC: 33.6 g/dL (ref 30.0–36.0)
MCV: 91.7 fl (ref 78.0–100.0)
Monocytes Absolute: 0.7 10*3/uL (ref 0.1–1.0)
Monocytes Relative: 14.4 % — ABNORMAL HIGH (ref 3.0–12.0)
Neutro Abs: 2.5 10*3/uL (ref 1.4–7.7)
Neutrophils Relative %: 52.4 % (ref 43.0–77.0)
Platelets: 160 10*3/uL (ref 150.0–400.0)
RBC: 5.61 Mil/uL (ref 4.22–5.81)
RDW: 13.7 % (ref 11.5–15.5)
WBC: 4.9 10*3/uL (ref 4.0–10.5)

## 2022-05-08 LAB — URINALYSIS, ROUTINE W REFLEX MICROSCOPIC
Bilirubin Urine: NEGATIVE
Hgb urine dipstick: NEGATIVE
Ketones, ur: NEGATIVE
Leukocytes,Ua: NEGATIVE
Nitrite: NEGATIVE
RBC / HPF: NONE SEEN (ref 0–?)
Specific Gravity, Urine: 1.02 (ref 1.000–1.030)
Total Protein, Urine: NEGATIVE
Urine Glucose: NEGATIVE
Urobilinogen, UA: 0.2 (ref 0.0–1.0)
pH: 6 (ref 5.0–8.0)

## 2022-05-08 LAB — BRAIN NATRIURETIC PEPTIDE: Pro B Natriuretic peptide (BNP): 8 pg/mL (ref 0.0–100.0)

## 2022-05-08 LAB — BASIC METABOLIC PANEL
BUN: 12 mg/dL (ref 6–23)
CO2: 25 mEq/L (ref 19–32)
Calcium: 9.4 mg/dL (ref 8.4–10.5)
Chloride: 101 mEq/L (ref 96–112)
Creatinine, Ser: 0.95 mg/dL (ref 0.40–1.50)
GFR: 99.29 mL/min (ref >60.00)
Glucose, Bld: 85 mg/dL (ref 70–99)
Potassium: 4.3 mEq/L (ref 3.5–5.1)
Sodium: 137 mEq/L (ref 135–145)

## 2022-05-08 LAB — LIPID PANEL
Cholesterol: 206 mg/dL — ABNORMAL HIGH (ref 0–200)
HDL: 76.6 mg/dL (ref >39.00)
LDL Cholesterol: 97 mg/dL (ref 0–99)
NonHDL: 129.03
Total CHOL/HDL Ratio: 3
Triglycerides: 161 mg/dL — ABNORMAL HIGH (ref 0.0–149.0)
VLDL: 32.2 mg/dL (ref 0.0–40.0)

## 2022-05-08 LAB — TSH: TSH: 1.36 u[IU]/mL (ref 0.35–5.50)

## 2022-05-08 LAB — PSA: PSA: 0.35 ng/mL (ref 0.10–4.00)

## 2022-05-08 LAB — TROPONIN I (HIGH SENSITIVITY): High Sens Troponin I: 8 ng/L (ref 2–17)

## 2022-05-08 MED ORDER — INDAPAMIDE 1.25 MG PO TABS: 1.2500 mg | ORAL_TABLET | Freq: Every day | ORAL | 0 refills | Status: DC

## 2022-05-08 MED ORDER — NEBIVOLOL HCL 5 MG PO TABS: ORAL_TABLET | ORAL | 0 refills | Status: DC

## 2022-05-08 NOTE — Progress Notes (Unsigned)
Subjective:  Patient ID: Mason Maddox, male    DOB: April 14, 1981  Age: 42 y.o. MRN: [id number]  CC: Annual Exam and Hypertension   HPI Mason Maddox presents for a CPX and f/up -  He complains of a 1 month history of left-sided neck pain with numbness and tingling in his left upper extremity.  He has been seeing a chiropractor but no x-rays have been done.  He has gotten some symptom relief with Aleve.  He denies trauma or injury.  He is not taking anything for high blood pressure and has had recent episodes of shortness of breath but denies chest pain, diaphoresis, palpitations, or edema.  Outpatient Medications Prior to Visit  Medication Sig Dispense Refill   azithromycin (ZITHROMAX) 250 MG tablet 2 tab by mouth on day 1, then 1 tab per day 6 tablet 0   cyclobenzaprine (FLEXERIL) 10 MG tablet Take 1 tablet by mouth 3 times daily as needed for muscle spasm. Warning: May cause drowsiness. 21 tablet 0   diclofenac (VOLTAREN) 75 MG EC tablet Take 1 tablet (75 mg total) by mouth 2 (two) times daily. 14 tablet 0   nebivolol (BYSTOLIC) 5 MG tablet TAKE 1 TABLET(5 MG) BY MOUTH DAILY 90 tablet 0   predniSONE (DELTASONE) 10 MG tablet 2 tabs by mouth per day for 5 days 10 tablet 0   Vitamin D, Ergocalciferol, (DRISDOL) 1.25 MG (50000 UT) CAPS capsule Take 1 capsule (50,000 Units total) by mouth every 7 (seven) days. 12 capsule 0   No facility-administered medications prior to visit.    ROS Review of Systems  Constitutional: Negative.  Negative for diaphoresis, fatigue and unexpected weight change.  HENT: Negative.    Eyes: Negative.   Respiratory:  Positive for shortness of breath. Negative for cough, chest tightness and wheezing.   Cardiovascular:  Negative for chest pain, palpitations and leg swelling.  Gastrointestinal:  Negative for abdominal pain, constipation, diarrhea and nausea.  Endocrine: Negative.   Genitourinary: Negative.  Negative for difficulty urinating.  Musculoskeletal:   Positive for neck pain. Negative for arthralgias.  Skin: Negative.  Negative for color change.  Neurological:  Positive for numbness. Negative for dizziness, weakness and light-headedness.  Hematological:  Negative for adenopathy. Does not bruise/bleed easily.  Psychiatric/Behavioral: Negative.      Objective:  BP (!) 148/100 (BP Location: Right Arm, Patient Position: Sitting, Cuff Size: Large)   Pulse 93   Temp 98.4 F (36.9 C) (Oral)   Ht 5\' 6"  (1.676 m)   Wt 220 lb (99.8 kg)   SpO2 96%   BMI 35.51 kg/m   BP Readings from Last 3 Encounters:  05/08/22 (!) 148/100  04/21/19 (!) 148/85  11/08/18 (!) 140/94    Wt Readings from Last 3 Encounters:  05/08/22 220 lb (99.8 kg)  04/21/19 201 lb 3.2 oz (91.3 kg)  11/08/18 200 lb (90.7 kg)    Physical Exam Vitals reviewed.  HENT:     Nose: Nose normal.     Mouth/Throat:     Mouth: Mucous membranes are moist.  Eyes:     General: No scleral icterus.    Conjunctiva/sclera: Conjunctivae normal.  Cardiovascular:     Rate and Rhythm: Normal rate and regular rhythm.     Heart sounds: No murmur heard.    Comments: EKG- NSR, 78 bpm LAE TWI in III and V1, V4-V6 is new No LVH or Q waves Pulmonary:     Effort: Pulmonary effort is normal.  Breath sounds: No stridor. No wheezing, rhonchi or rales.  Abdominal:     General: Abdomen is flat.     Palpations: There is no mass.     Tenderness: There is no abdominal tenderness. There is no guarding.     Hernia: No hernia is present.  Musculoskeletal:     Cervical back: Normal range of motion and neck supple.     Right lower leg: No edema.     Left lower leg: No edema.  Skin:    General: Skin is warm and dry.     Findings: No rash.  Neurological:     General: No focal deficit present.     Mental Status: He is alert.     Cranial Nerves: Cranial nerves 2-12 are intact.     Sensory: Sensation is intact.     Motor: Motor function is intact. No weakness.     Coordination:  Coordination is intact.     Deep Tendon Reflexes: Reflexes normal.     Reflex Scores:      Tricep reflexes are 0 on the right side and 0 on the left side.      Bicep reflexes are 0 on the right side and 0 on the left side.      Brachioradialis reflexes are 0 on the right side and 0 on the left side.      Patellar reflexes are 1+ on the right side and 1+ on the left side.      Achilles reflexes are 0 on the right side and 0 on the left side. Psychiatric:        Mood and Affect: Mood normal.        Behavior: Behavior normal.     Lab Results  Component Value Date   WBC 4.9 05/08/2022   HGB 17.3 (H) 05/08/2022   HCT 51.5 05/08/2022   PLT 160.0 05/08/2022   GLUCOSE 85 05/08/2022   CHOL 206 (H) 05/08/2022   TRIG 161.0 (H) 05/08/2022   HDL 76.60 05/08/2022   LDLCALC 97 05/08/2022   ALT 42 05/08/2022   AST 28 05/08/2022   NA 137 05/08/2022   K 4.3 05/08/2022   CL 101 05/08/2022   CREATININE 0.95 05/08/2022   BUN 12 05/08/2022   CO2 25 05/08/2022   TSH 1.36 05/08/2022   PSA 0.35 05/08/2022   INR 0.9 02/23/2017   HGBA1C 5.0 07/10/2016   DG Cervical Spine Complete  Result Date: 05/10/2022 CLINICAL DATA:  Left-sided neck pain for 1 month without known injury. EXAM: CERVICAL SPINE - COMPLETE 4+ VIEW COMPARISON:  None Available. FINDINGS: There is no evidence of cervical spine fracture or prevertebral soft tissue swelling. Alignment is normal. Mild degenerative disc disease is noted at C4-5, C5-6 and C6-7. No significant neural foraminal stenosis. IMPRESSION: Mild multilevel degenerative disc disease. No acute abnormality seen. Electronically Signed   By: Lupita Raider M.D.   On: 05/10/2022 19:27     Assessment & Plan:   Mason Maddox was seen today for annual exam and hypertension.  Diagnoses and all orders for this visit:  Primary hypertension- His blood pressure is not adequately well-controlled and he is symptomatic.  Will start a beta-blocker and thiazide diuretic. -     EKG  12-Lead -     Basic metabolic panel; Future -     CBC with Differential/Platelet; Future -     TSH; Future -     Urinalysis, Routine w reflex microscopic; Future -  Hepatic function panel; Future -     Hepatic function panel -     Urinalysis, Routine w reflex microscopic -     TSH -     CBC with Differential/Platelet -     Basic metabolic panel -     nebivolol (BYSTOLIC) 5 MG tablet; TAKE 1 TABLET(5 MG) BY MOUTH DAILY -     indapamide (LOZOL) 1.25 MG tablet; Take 1 tablet (1.25 mg total) by mouth daily.  Encounter for routine adult health examination with abnormal findings- Exam completed, labs reviewed- Statin is not indicated, vaccines reviewed, cancer screenings are up-to-date, patient education was given. -     Lipid panel; Future -     PSA; Future -     Hepatitis C antibody; Future -     Hepatitis C antibody -     PSA -     Lipid panel  Need for hepatitis C screening test -     Hepatitis C antibody; Future -     Hepatitis C antibody  Radiculitis of left cervical region- X-rays showed DDD.  He is neurologically intact. -     DG Cervical Spine Complete; Future  DOE (dyspnea on exertion)- Labs are reassuring. -     Troponin I (High Sensitivity); Future -     Brain natriuretic peptide; Future -     Brain natriuretic peptide -     Troponin I (High Sensitivity)  Abnormal electrocardiogram (ECG) (EKG)- Labs are reassuring and he has a low ASCVD risk score.  I think this is caused by poorly controlled hypertension. -     Troponin I (High Sensitivity); Future -     Brain natriuretic peptide; Future -     Brain natriuretic peptide -     Troponin I (High Sensitivity)   I have discontinued Dwana Melena. Mason Maddox's Vitamin D (Ergocalciferol), diclofenac, cyclobenzaprine, predniSONE, and azithromycin. I am also having him start on indapamide. Additionally, I am having him maintain his nebivolol.  Meds ordered this encounter  Medications   nebivolol (BYSTOLIC) 5 MG tablet    Sig:  TAKE 1 TABLET(5 MG) BY MOUTH DAILY    Dispense:  90 tablet    Refill:  0   indapamide (LOZOL) 1.25 MG tablet    Sig: Take 1 tablet (1.25 mg total) by mouth daily.    Dispense:  90 tablet    Refill:  0     Follow-up: Return in about 4 weeks (around 06/05/2022).  Scarlette Calico, MD

## 2022-05-08 NOTE — Patient Instructions (Signed)
Hypertension, Adult High blood pressure (hypertension) is when the force of blood pumping through the arteries is too strong. The arteries are the blood vessels that carry blood from the heart throughout the body. Hypertension forces the heart to work harder to pump blood and may cause arteries to become narrow or stiff. Untreated or uncontrolled hypertension can lead to a heart attack, heart failure, a stroke, kidney disease, and other problems. A blood pressure reading consists of a higher number over a lower number. Ideally, your blood pressure should be below 120/80. The first ("top") number is called the systolic pressure. It is a measure of the pressure in your arteries as your heart beats. The second ("bottom") number is called the diastolic pressure. It is a measure of the pressure in your arteries as the heart relaxes. What are the causes? The exact cause of this condition is not known. There are some conditions that result in high blood pressure. What increases the risk? Certain factors may make you more likely to develop high blood pressure. Some of these risk factors are under your control, including: Smoking. Not getting enough exercise or physical activity. Being overweight. Having too much fat, sugar, calories, or salt (sodium) in your diet. Drinking too much alcohol. Other risk factors include: Having a personal history of heart disease, diabetes, high cholesterol, or kidney disease. Stress. Having a family history of high blood pressure and high cholesterol. Having obstructive sleep apnea. Age. The risk increases with age. What are the signs or symptoms? High blood pressure may not cause symptoms. Very high blood pressure (hypertensive crisis) may cause: Headache. Fast or irregular heartbeats (palpitations). Shortness of breath. Nosebleed. Nausea and vomiting. Vision changes. Severe chest pain, dizziness, and seizures. How is this diagnosed? This condition is diagnosed by  measuring your blood pressure while you are seated, with your arm resting on a flat surface, your legs uncrossed, and your feet flat on the floor. The cuff of the blood pressure monitor will be placed directly against the skin of your upper arm at the level of your heart. Blood pressure should be measured at least twice using the same arm. Certain conditions can cause a difference in blood pressure between your right and left arms. If you have a high blood pressure reading during one visit or you have normal blood pressure with other risk factors, you may be asked to: Return on a different day to have your blood pressure checked again. Monitor your blood pressure at home for 1 week or longer. If you are diagnosed with hypertension, you may have other blood or imaging tests to help your health care provider understand your overall risk for other conditions. How is this treated? This condition is treated by making healthy lifestyle changes, such as eating healthy foods, exercising more, and reducing your alcohol intake. You may be referred for counseling on a healthy diet and physical activity. Your health care provider may prescribe medicine if lifestyle changes are not enough to get your blood pressure under control and if: Your systolic blood pressure is above 130. Your diastolic blood pressure is above 80. Your personal target blood pressure may vary depending on your medical conditions, your age, and other factors. Follow these instructions at home: Eating and drinking  Eat a diet that is high in fiber and potassium, and low in sodium, added sugar, and fat. An example of this eating plan is called the DASH diet. DASH stands for Dietary Approaches to Stop Hypertension. To eat this way: Eat   plenty of fresh fruits and vegetables. Try to fill one half of your plate at each meal with fruits and vegetables. Eat whole grains, such as whole-wheat pasta, brown rice, or whole-grain bread. Fill about one  fourth of your plate with whole grains. Eat or drink low-fat dairy products, such as skim milk or low-fat yogurt. Avoid fatty cuts of meat, processed or cured meats, and poultry with skin. Fill about one fourth of your plate with lean proteins, such as fish, chicken without skin, beans, eggs, or tofu. Avoid pre-made and processed foods. These tend to be higher in sodium, added sugar, and fat. Reduce your daily sodium intake. Many people with hypertension should eat less than 1,500 mg of sodium a day. Do not drink alcohol if: Your health care provider tells you not to drink. You are pregnant, may be pregnant, or are planning to become pregnant. If you drink alcohol: Limit how much you have to: 0-1 drink a day for women. 0-2 drinks a day for men. Know how much alcohol is in your drink. In the [place] one drink equals one 12 oz bottle of beer (355 mL), one 5 oz glass of wine (148 mL), or one 1 oz glass of hard liquor (44 mL). Lifestyle  Work with your health care provider to maintain a healthy body weight or to lose weight. Ask what an ideal weight is for you. Get at least 30 minutes of exercise that causes your heart to beat faster (aerobic exercise) most days of the week. Activities may include walking, swimming, or biking. Include exercise to strengthen your muscles (resistance exercise), such as Pilates or lifting weights, as part of your weekly exercise routine. Try to do these types of exercises for 30 minutes at least 3 days a week. Do not use any products that contain nicotine or tobacco. These products include cigarettes, chewing tobacco, and vaping devices, such as e-cigarettes. If you need help quitting, ask your health care provider. Monitor your blood pressure at home as told by your health care provider. Keep all follow-up visits. This is important. Medicines Take over-the-counter and prescription medicines only as told by your health care provider. Follow directions carefully. Blood  pressure medicines must be taken as prescribed. Do not skip doses of blood pressure medicine. Doing this puts you at risk for problems and can make the medicine less effective. Ask your health care provider about side effects or reactions to medicines that you should watch for. Contact a health care provider if you: Think you are having a reaction to a medicine you are taking. Have headaches that keep coming back (recurring). Feel dizzy. Have swelling in your ankles. Have trouble with your vision. Get help right away if you: Develop a severe headache or confusion. Have unusual weakness or numbness. Feel faint. Have severe pain in your chest or abdomen. Vomit repeatedly. Have trouble breathing. These symptoms may be an emergency. Get help right away. Call 911. Do not wait to see if the symptoms will go away. Do not drive yourself to the hospital. Summary Hypertension is when the force of blood pumping through your arteries is too strong. If this condition is not controlled, it may put you at risk for serious complications. Your personal target blood pressure may vary depending on your medical conditions, your age, and other factors. For most people, a normal blood pressure is less than 120/80. Hypertension is treated with lifestyle changes, medicines, or a combination of both. Lifestyle changes include losing weight, eating a healthy,   low-sodium diet, exercising more, and limiting alcohol. This information is not intended to replace advice given to you by your health care provider. Make sure you discuss any questions you have with your health care provider. Document Revised: 02/05/2021 Document Reviewed: 02/05/2021 Elsevier Patient Education  2023 Elsevier Inc.  

## 2022-05-09 LAB — HEPATITIS C ANTIBODY: Hepatitis C Ab: NONREACTIVE

## 2022-05-12 DIAGNOSIS — M5030 Other cervical disc degeneration, unspecified cervical region: Secondary | ICD-10-CM | POA: Insufficient documentation

## 2022-08-06 ENCOUNTER — Telehealth: Payer: Self-pay | Admitting: Internal Medicine

## 2022-08-06 ENCOUNTER — Other Ambulatory Visit: Payer: Self-pay | Admitting: Internal Medicine

## 2022-08-06 DIAGNOSIS — I10 Essential (primary) hypertension: Secondary | ICD-10-CM

## 2022-08-06 MED ORDER — NEBIVOLOL HCL 5 MG PO TABS: ORAL_TABLET | ORAL | 0 refills | Status: DC

## 2022-08-06 MED ORDER — INDAPAMIDE 1.25 MG PO TABS: 1.2500 mg | ORAL_TABLET | Freq: Every day | ORAL | 0 refills | Status: DC

## 2022-08-06 NOTE — Telephone Encounter (Signed)
Prescription Request  08/06/2022  LOV: 05/08/2022  What is the name of the medication or equipment? Indapamide and nebivolol  Have you contacted your pharmacy to request a refill? Yes   Which pharmacy would you like this sent to?    [facility] [facility]   Patient notified that their request is being sent to the clinical staff for review and that they should receive a response within 2 business days.   Please advise at Mobile [phone] (mobile)

## 2022-11-01 ENCOUNTER — Other Ambulatory Visit: Payer: Self-pay | Admitting: Internal Medicine

## 2022-11-01 DIAGNOSIS — I10 Essential (primary) hypertension: Secondary | ICD-10-CM

## 2022-11-24 ENCOUNTER — Other Ambulatory Visit: Payer: Self-pay | Admitting: Internal Medicine

## 2022-11-24 DIAGNOSIS — I10 Essential (primary) hypertension: Secondary | ICD-10-CM

## 2022-11-25 ENCOUNTER — Encounter: Payer: Self-pay | Admitting: Internal Medicine

## 2022-11-25 ENCOUNTER — Ambulatory Visit (INDEPENDENT_AMBULATORY_CARE_PROVIDER_SITE_OTHER): Payer: BLUE CROSS/BLUE SHIELD | Admitting: Internal Medicine

## 2022-11-25 VITALS — BP 132/80 | HR 91 | Temp 98.4°F | Resp 16 | Ht 66.0 in | Wt 214.0 lb

## 2022-11-25 DIAGNOSIS — I4891 Unspecified atrial fibrillation: Secondary | ICD-10-CM | POA: Diagnosis not present

## 2022-11-25 DIAGNOSIS — I4892 Unspecified atrial flutter: Secondary | ICD-10-CM | POA: Diagnosis not present

## 2022-11-25 DIAGNOSIS — I10 Essential (primary) hypertension: Secondary | ICD-10-CM

## 2022-11-25 LAB — CBC WITH DIFFERENTIAL/PLATELET
Basophils Absolute: 0 10*3/uL (ref 0.0–0.1)
Basophils Relative: 0.6 % (ref 0.0–3.0)
Eosinophils Absolute: 0.1 10*3/uL (ref 0.0–0.7)
Eosinophils Relative: 2.3 % (ref 0.0–5.0)
HCT: 51.9 % (ref 39.0–52.0)
Hemoglobin: 17 g/dL (ref 13.0–17.0)
Lymphocytes Relative: 24.2 % (ref 12.0–46.0)
Lymphs Abs: 1.5 10*3/uL (ref 0.7–4.0)
MCHC: 32.7 g/dL (ref 30.0–36.0)
MCV: 94.6 fl (ref 78.0–100.0)
Monocytes Absolute: 0.6 10*3/uL (ref 0.1–1.0)
Monocytes Relative: 9.8 % (ref 3.0–12.0)
Neutro Abs: 3.8 10*3/uL (ref 1.4–7.7)
Neutrophils Relative %: 63.1 % (ref 43.0–77.0)
Platelets: 163 10*3/uL (ref 150.0–400.0)
RBC: 5.49 Mil/uL (ref 4.22–5.81)
RDW: 14.4 % (ref 11.5–15.5)
WBC: 6.1 10*3/uL (ref 4.0–10.5)

## 2022-11-25 LAB — TROPONIN I (HIGH SENSITIVITY): High Sens Troponin I: 11 ng/L (ref 2–17)

## 2022-11-25 LAB — BASIC METABOLIC PANEL
BUN: 11 mg/dL (ref 6–23)
CO2: 28 mEq/L (ref 19–32)
Calcium: 9.5 mg/dL (ref 8.4–10.5)
Chloride: 98 mEq/L (ref 96–112)
Creatinine, Ser: 0.93 mg/dL (ref 0.40–1.50)
GFR: 101.47 mL/min (ref >60.00)
Glucose, Bld: 88 mg/dL (ref 70–99)
Potassium: 3.8 mEq/L (ref 3.5–5.1)
Sodium: 135 mEq/L (ref 135–145)

## 2022-11-25 LAB — D-DIMER, QUANTITATIVE: D-Dimer, Quant: <0.19 mcg/mL FEU (ref <0.50)

## 2022-11-25 LAB — BRAIN NATRIURETIC PEPTIDE: Pro B Natriuretic peptide (BNP): 29 pg/mL (ref 0.0–100.0)

## 2022-11-25 LAB — MAGNESIUM: Magnesium: 1.6 mg/dL (ref 1.5–2.5)

## 2022-11-25 MED ORDER — DILTIAZEM HCL ER COATED BEADS 120 MG PO CP24
120.0000 mg | ORAL_CAPSULE | Freq: Every day | ORAL | 0 refills | Status: DC
Start: 2022-11-25 — End: 2023-02-20

## 2022-11-25 NOTE — Progress Notes (Unsigned)
   Subjective:  Patient ID: Mason Maddox, male    DOB: May 01, 1980  Age: 42 y.o. MRN: [id number]  CC: Hypertension   HPI RAYVEN UGOLINI presents for f/up ----  Outpatient Medications Prior to Visit  Medication Sig Dispense Refill   indapamide (LOZOL) 1.25 MG tablet Take 1 tablet (1.25 mg total) by mouth daily. 90 tablet 0   nebivolol (BYSTOLIC) 5 MG tablet TAKE 1 TABLET(5 MG) BY MOUTH DAILY 90 tablet 0   No facility-administered medications prior to visit.    ROS Review of Systems  Objective:  BP 132/80 (BP Location: Right Arm, Patient Position: Sitting, Cuff Size: Large)   Pulse 91   Temp 98.4 F (36.9 C) (Oral)   Resp 16   Ht 5\' 6"  (1.676 m)   Wt 214 lb (97.1 kg)   SpO2 98%   BMI 34.54 kg/m   BP Readings from Last 3 Encounters:  11/25/22 132/80  05/08/22 (!) 148/100  04/21/19 (!) 148/85    Wt Readings from Last 3 Encounters:  11/25/22 214 lb (97.1 kg)  05/08/22 220 lb (99.8 kg)  04/21/19 201 lb 3.2 oz (91.3 kg)    Physical Exam Cardiovascular:     Rate and Rhythm: Tachycardia present. Rhythm irregularly irregular.     Comments: EKG- A fib/flutter with RVR and PVC's, 134 bpm - new LAD, minimal LVH No Q waves Musculoskeletal:     Right lower leg: No edema.     Left lower leg: No edema.     Lab Results  Component Value Date   WBC 4.9 05/08/2022   HGB 17.3 (H) 05/08/2022   HCT 51.5 05/08/2022   PLT 160.0 05/08/2022   GLUCOSE 85 05/08/2022   CHOL 206 (H) 05/08/2022   TRIG 161.0 (H) 05/08/2022   HDL 76.60 05/08/2022   LDLCALC 97 05/08/2022   ALT 42 05/08/2022   AST 28 05/08/2022   NA 137 05/08/2022   K 4.3 05/08/2022   CL 101 05/08/2022   CREATININE 0.95 05/08/2022   BUN 12 05/08/2022   CO2 25 05/08/2022   TSH 1.36 05/08/2022   PSA 0.35 05/08/2022   INR 0.9 02/23/2017   HGBA1C 5.0 07/10/2016    No results found.  Assessment & Plan:  Primary hypertension -     Basic metabolic panel; Future -     CBC with Differential/Platelet;  Future -     EKG 12-Lead -     Magnesium; Future  Atrial fibrillation and flutter (HCC) -     dilTIAZem HCl ER Coated Beads; Take 1 capsule (120 mg total) by mouth daily.  Dispense: 90 capsule; Refill: 0 -     Troponin I (High Sensitivity); Future -     Brain natriuretic peptide; Future -     D-dimer, quantitative; Future -     Magnesium; Future -     Ambulatory referral to Cardiology     Follow-up: Return in about 3 months (around 02/25/2023).  Sanda Linger, MD

## 2022-11-25 NOTE — Patient Instructions (Signed)
Atrial Fibrillation Atrial fibrillation (AFib) is a type of irregular or rapid heartbeat (arrhythmia). In AFib, the top part of the heart (atria) beats in an irregular pattern. This makes the heart unable to pump blood normally and effectively. The goal of treatment is to prevent blood clots from forming, control your heart rate, or restore your heartbeat to a normal rhythm. If this condition is not treated, it can cause serious problems, such as a weakened heart muscle (cardiomyopathy) or a stroke. What are the causes? This condition is often caused by medical conditions that damage the heart's electrical system. These include: High blood pressure (hypertension). This is the most common cause. Certain heart problems or conditions, such as heart failure, coronary artery disease, heart valve problems, or heart surgery. Diabetes. Overactive thyroid (hyperthyroidism). Chronic kidney disease. Certain lung conditions, such as emphysema, pneumonia, or COPD. Obstructive sleep apnea. In some cases, the cause of this condition is not known. What increases the risk? This condition is more likely to develop in: Older adults. Athletes who do endurance exercise. People who have a family history of AFib. Males. People who are Caucasian. People who are obese. People who smoke or misuse alcohol. What are the signs or symptoms? Symptoms of this condition include: Fast or irregular heartbeats (palpitations). Discomfort or pain in your chest. Shortness of breath. Sudden light-headedness or weakness. Tiring easily during exercise or activity. Syncope (fainting). Sweating. In some cases, there are no symptoms. How is this diagnosed? Your health care provider may detect AFib when taking your pulse. If detected, this condition may be diagnosed with: An electrocardiogram (ECG) to check electrical signals of the heart. An ambulatory cardiac monitor to record your heart's activity for a few days. A  transthoracic echocardiogram (TTE) to create pictures of your heart. A transesophageal echocardiogram (TEE) to create even clearer pictures of your heart. A stress test to check your blood supply while you exercise. Imaging tests, such as a CT scan or chest X-ray. Blood tests. How is this treated? Treatment depends on underlying conditions and how you feel when you get AFib. This condition may be treated with: Medicines to prevent blood clots or to treat heart rate or heart rhythm problems. Electrical cardioversion to reset the heart's rhythm. A pacemaker to correct abnormal heart rhythm. Ablation to remove the heart tissue that sends abnormal signals. Left atrial appendage closure to seal the area where blood clots can form. In some cases, underlying conditions will be treated. Follow these instructions at home: Medicines Take over-the counter and prescription medicines only as told by your provider. Do not take any new medicines without talking to your provider. If you are taking blood thinners: Talk with your provider before taking aspirin or NSAIDs. These medicines can raise your risk of bleeding. Take your medicines as told. Take them at the same time each day. Do not do things that could hurt or bruise you. Be careful to avoid falls. Wear an alert bracelet or carry a card that says that you take blood thinners. Lifestyle Do not use any products that contain nicotine or tobacco. These products include cigarettes, chewing tobacco, and vaping devices, such as e-cigarettes. If you need help quitting, ask your provider. Eat heart-healthy foods. Talk with a food expert (dietitian) to make an eating plan that is right for you. Exercise regularly as told by your provider. Do not drink alcohol. Lose weight if you are overweight. General instructions If you have obstructive sleep apnea, manage your condition as told by your provider.   Do not use diet pills unless your provider approves. Diet  pills can make heart problems worse. Keep all follow-up visits. Your provider will want to check your heart rate and rhythm regularly. Contact a health care provider if: You notice a change in the rate, rhythm, or strength of your heartbeat. You are taking a blood thinner and you notice more bruising. You tire more easily when you exercise or do heavy work. You have a sudden change in weight. Get help right away if:  You have chest pain. You have trouble breathing. You have side effects of blood thinners, such as blood in your vomit, poop (stool), or pee (urine), or bleeding that does not stop. You have any symptoms of a stroke. "BE FAST" is an easy way to remember the main warning signs of a stroke: B - Balance. Signs are dizziness, sudden trouble walking, or loss of balance. E - Eyes. Signs are trouble seeing or a sudden change in vision. F - Face. Signs are sudden weakness or numbness of the face, or the face or eyelid drooping on one side. A - Arms. Signs are weakness or numbness in an arm. This happens suddenly and usually on one side of the body. S - Speech.Signs are sudden trouble speaking, slurred speech, or trouble understanding what people say. T - Time. Time to call emergency services. Write down what time symptoms started. Other signs of a stroke, such as: A sudden, severe headache with no known cause. Nausea or vomiting. Seizure. These symptoms may be an emergency. Get help right away. Call 911. Do not wait to see if the symptoms will go away. Do not drive yourself to the hospital. This information is not intended to replace advice given to you by your health care provider. Make sure you discuss any questions you have with your health care provider. Document Revised: 12/18/2021 Document Reviewed: 12/18/2021 Elsevier Patient Education  2024 Elsevier Inc.  

## 2022-11-28 ENCOUNTER — Telehealth: Payer: Self-pay | Admitting: Internal Medicine

## 2022-11-28 ENCOUNTER — Other Ambulatory Visit: Payer: Self-pay | Admitting: Internal Medicine

## 2022-11-28 ENCOUNTER — Ambulatory Visit: Payer: BLUE CROSS/BLUE SHIELD | Admitting: Cardiology

## 2022-11-28 NOTE — Telephone Encounter (Signed)
Patient was referred to cardiology by Dr. Yetta Barre. He said his insurance won't cover that office. He would like to know if the referral can be changed to a cardiologist within [facility]. Best callback is [phone].

## 2023-02-20 ENCOUNTER — Other Ambulatory Visit: Payer: Self-pay | Admitting: Internal Medicine

## 2023-02-20 DIAGNOSIS — I4891 Unspecified atrial fibrillation: Secondary | ICD-10-CM

## 2023-05-23 ENCOUNTER — Other Ambulatory Visit: Payer: Self-pay | Admitting: Internal Medicine

## 2023-05-23 DIAGNOSIS — I4891 Unspecified atrial fibrillation: Secondary | ICD-10-CM

## 2023-06-04 ENCOUNTER — Ambulatory Visit: Payer: No Typology Code available for payment source | Admitting: Internal Medicine

## 2023-06-04 ENCOUNTER — Encounter: Payer: Self-pay | Admitting: Internal Medicine

## 2023-06-04 VITALS — BP 120/86 | HR 76 | Temp 98.3°F | Ht 66.0 in | Wt 209.6 lb

## 2023-06-04 DIAGNOSIS — R1012 Left upper quadrant pain: Secondary | ICD-10-CM

## 2023-06-04 DIAGNOSIS — E781 Pure hyperglyceridemia: Secondary | ICD-10-CM

## 2023-06-04 DIAGNOSIS — Z23 Encounter for immunization: Secondary | ICD-10-CM

## 2023-06-04 DIAGNOSIS — Z0000 Encounter for general adult medical examination without abnormal findings: Secondary | ICD-10-CM

## 2023-06-04 DIAGNOSIS — Z0001 Encounter for general adult medical examination with abnormal findings: Secondary | ICD-10-CM

## 2023-06-04 DIAGNOSIS — I10 Essential (primary) hypertension: Secondary | ICD-10-CM

## 2023-06-04 DIAGNOSIS — G5601 Carpal tunnel syndrome, right upper limb: Secondary | ICD-10-CM | POA: Insufficient documentation

## 2023-06-04 DIAGNOSIS — M5412 Radiculopathy, cervical region: Secondary | ICD-10-CM

## 2023-06-04 DIAGNOSIS — E559 Vitamin D deficiency, unspecified: Secondary | ICD-10-CM

## 2023-06-04 LAB — CBC WITH DIFFERENTIAL/PLATELET
Basophils Absolute: 0 10*3/uL (ref 0.0–0.1)
Basophils Relative: 0.5 % (ref 0.0–3.0)
Eosinophils Absolute: 0.2 10*3/uL (ref 0.0–0.7)
Eosinophils Relative: 2.6 % (ref 0.0–5.0)
HCT: 50.8 % (ref 39.0–52.0)
Hemoglobin: 16.7 g/dL (ref 13.0–17.0)
Lymphocytes Relative: 29.2 % (ref 12.0–46.0)
Lymphs Abs: 2 10*3/uL (ref 0.7–4.0)
MCHC: 33 g/dL (ref 30.0–36.0)
MCV: 92.9 fL (ref 78.0–100.0)
Monocytes Absolute: 0.8 10*3/uL (ref 0.1–1.0)
Monocytes Relative: 11.8 % (ref 3.0–12.0)
Neutro Abs: 3.8 10*3/uL (ref 1.4–7.7)
Neutrophils Relative %: 55.9 % (ref 43.0–77.0)
Platelets: 169 10*3/uL (ref 150.0–400.0)
RBC: 5.47 Mil/uL (ref 4.22–5.81)
RDW: 13.3 % (ref 11.5–15.5)
WBC: 6.9 10*3/uL (ref 4.0–10.5)

## 2023-06-04 LAB — BASIC METABOLIC PANEL
BUN: 10 mg/dL (ref 6–23)
CO2: 32 meq/L (ref 19–32)
Calcium: 9.2 mg/dL (ref 8.4–10.5)
Chloride: 100 meq/L (ref 96–112)
Creatinine, Ser: 1.07 mg/dL (ref 0.40–1.50)
GFR: 85.44 mL/min (ref >60.00)
Glucose, Bld: 80 mg/dL (ref 70–99)
Potassium: 4.3 meq/L (ref 3.5–5.1)
Sodium: 139 meq/L (ref 135–145)

## 2023-06-04 LAB — PSA: PSA: 0.33 ng/mL (ref 0.10–4.00)

## 2023-06-04 LAB — URINALYSIS, ROUTINE W REFLEX MICROSCOPIC
Bilirubin Urine: NEGATIVE
Hgb urine dipstick: NEGATIVE
Ketones, ur: NEGATIVE
Leukocytes,Ua: NEGATIVE
Nitrite: NEGATIVE
RBC / HPF: NONE SEEN (ref 0–?)
Specific Gravity, Urine: 1.02 (ref 1.000–1.030)
Total Protein, Urine: NEGATIVE
Urine Glucose: NEGATIVE
Urobilinogen, UA: 0.2 (ref 0.0–1.0)
WBC, UA: NONE SEEN (ref 0–?)
pH: 8 (ref 5.0–8.0)

## 2023-06-04 LAB — HEPATIC FUNCTION PANEL
ALT: 24 U/L (ref 0–53)
AST: 19 U/L (ref 0–37)
Albumin: 4.7 g/dL (ref 3.5–5.2)
Alkaline Phosphatase: 80 U/L (ref 39–117)
Bilirubin, Direct: 0.1 mg/dL (ref 0.0–0.3)
Total Bilirubin: 0.4 mg/dL (ref 0.2–1.2)
Total Protein: 7.2 g/dL (ref 6.0–8.3)

## 2023-06-04 LAB — LIPASE: Lipase: 35 U/L (ref 11.0–59.0)

## 2023-06-04 LAB — LIPID PANEL
Cholesterol: 159 mg/dL (ref 0–200)
HDL: 54.3 mg/dL (ref >39.00)
LDL Cholesterol: 74 mg/dL (ref 0–99)
NonHDL: 104.69
Total CHOL/HDL Ratio: 3
Triglycerides: 151 mg/dL — ABNORMAL HIGH (ref 0.0–149.0)
VLDL: 30.2 mg/dL (ref 0.0–40.0)

## 2023-06-04 LAB — VITAMIN D 25 HYDROXY (VIT D DEFICIENCY, FRACTURES): VITD: 28.29 ng/mL — ABNORMAL LOW (ref 30.00–100.00)

## 2023-06-04 MED ORDER — MELOXICAM 7.5 MG PO TABS: 7.5000 mg | ORAL_TABLET | Freq: Every day | ORAL | 0 refills | Status: DC | PRN

## 2023-06-04 NOTE — Patient Instructions (Signed)
 Health Maintenance, Male  Adopting a healthy lifestyle and getting preventive care are important in promoting health and wellness. Ask your health care provider about:  The right schedule for you to have regular tests and exams.  Things you can do on your own to prevent diseases and keep yourself healthy.  What should I know about diet, weight, and exercise?  Eat a healthy diet    Eat a diet that includes plenty of vegetables, fruits, low-fat dairy products, and lean protein.  Do not eat a lot of foods that are high in solid fats, added sugars, or sodium.  Maintain a healthy weight  Body mass index (BMI) is a measurement that can be used to identify possible weight problems. It estimates body fat based on height and weight. Your health care provider can help determine your BMI and help you achieve or maintain a healthy weight.  Get regular exercise  Get regular exercise. This is one of the most important things you can do for your health. Most adults should:  Exercise for at least 150 minutes each week. The exercise should increase your heart rate and make you sweat (moderate-intensity exercise).  Do strengthening exercises at least twice a week. This is in addition to the moderate-intensity exercise.  Spend less time sitting. Even light physical activity can be beneficial.  Watch cholesterol and blood lipids  Have your blood tested for lipids and cholesterol at 43 years of age, then have this test every 5 years.  You may need to have your cholesterol levels checked more often if:  Your lipid or cholesterol levels are high.  You are older than 43 years of age.  You are at high risk for heart disease.  What should I know about cancer screening?  Many types of cancers can be detected early and may often be prevented. Depending on your health history and family history, you may need to have cancer screening at various ages. This may include screening for:  Colorectal cancer.  Prostate cancer.  Skin cancer.  Lung  cancer.  What should I know about heart disease, diabetes, and high blood pressure?  Blood pressure and heart disease  High blood pressure causes heart disease and increases the risk of stroke. This is more likely to develop in people who have high blood pressure readings or are overweight.  Talk with your health care provider about your target blood pressure readings.  Have your blood pressure checked:  Every 3-5 years if you are 9-95 years of age.  Every year if you are 85 years old or older.  If you are between the ages of 29 and 29 and are a current or former smoker, ask your health care provider if you should have a one-time screening for abdominal aortic aneurysm (AAA).  Diabetes  Have regular diabetes screenings. This checks your fasting blood sugar level. Have the screening done:  Once every three years after age 23 if you are at a normal weight and have a low risk for diabetes.  More often and at a younger age if you are overweight or have a high risk for diabetes.  What should I know about preventing infection?  Hepatitis B  If you have a higher risk for hepatitis B, you should be screened for this virus. Talk with your health care provider to find out if you are at risk for hepatitis B infection.  Hepatitis C  Blood testing is recommended for:  Everyone born from [birth year] through 1965.  Anyone  with known risk factors for hepatitis C.  Sexually transmitted infections (STIs)  You should be screened each year for STIs, including gonorrhea and chlamydia, if:  You are sexually active and are younger than 43 years of age.  You are older than 43 years of age and your health care provider tells you that you are at risk for this type of infection.  Your sexual activity has changed since you were last screened, and you are at increased risk for chlamydia or gonorrhea. Ask your health care provider if you are at risk.  Ask your health care provider about whether you are at high risk for HIV. Your health care provider  may recommend a prescription medicine to help prevent HIV infection. If you choose to take medicine to prevent HIV, you should first get tested for HIV. You should then be tested every 3 months for as long as you are taking the medicine.  Follow these instructions at home:  Alcohol use  Do not drink alcohol if your health care provider tells you not to drink.  If you drink alcohol:  Limit how much you have to 0-2 drinks a day.  Know how much alcohol is in your drink. In the [place] one drink equals one 12 oz bottle of beer (355 mL), one 5 oz glass of wine (148 mL), or one 1 oz glass of hard liquor (44 mL).  Lifestyle  Do not use any products that contain nicotine or tobacco. These products include cigarettes, chewing tobacco, and vaping devices, such as e-cigarettes. If you need help quitting, ask your health care provider.  Do not use street drugs.  Do not share needles.  Ask your health care provider for help if you need support or information about quitting drugs.  General instructions  Schedule regular health, dental, and eye exams.  Stay current with your vaccines.  Tell your health care provider if:  You often feel depressed.  You have ever been abused or do not feel safe at home.  Summary  Adopting a healthy lifestyle and getting preventive care are important in promoting health and wellness.  Follow your health care provider's instructions about healthy diet, exercising, and getting tested or screened for diseases.  Follow your health care provider's instructions on monitoring your cholesterol and blood pressure.  This information is not intended to replace advice given to you by your health care provider. Make sure you discuss any questions you have with your health care provider.  Document Revised: 08/20/2020 Document Reviewed: 08/20/2020  Elsevier Patient Education  2024 [employer].

## 2023-06-04 NOTE — Progress Notes (Unsigned)
Subjective:  Patient ID: Mason Maddox, male    DOB: 05-23-1980  Age: 43 y.o. MRN: [id number]  CC: Annual Exam, Hypertension, and Abdominal Pain   HPI Mason Maddox presents for a CPX and f/up -----  Discussed the use of AI scribe software for clinical note transcription with the patient, who gave verbal consent to proceed.  History of Present Illness   Mason Maddox is a 43 year old male who presents with tingling and pain in the right arm and hand, primarily at night.  He experiences tingling and pain in his right arm and hand, primarily at night, which disrupts his sleep. The symptoms are absent during the day when he is active. The tingling and pain are localized to his right arm and hand, with no symptoms elsewhere. No significant neck discomfort, only slight, and no weakness in the hand, with the primary issues being tingling and pain.  Approximately two months ago, he visited urgent care and was told he might have a pinched nerve in his neck causing shooting pains down his arm. He was prescribed Flexeril and prednisone, which provided temporary relief, but the symptoms returned after the medication was finished. He was advised against prolonged steroid use.  He recently started a job at [employer] in October, which involves lifting heavy packages. He is right-handed and his work involves repetitive activity with both hands, particularly heavy lifting. He suspects this may be contributing to his symptoms.   He is currently taking diltiazem for hypertension. He saw a cardiologist 2 weeks ago.        Outpatient Medications Prior to Visit  Medication Sig Dispense Refill   diltiazem (CARDIZEM CD) 120 MG 24 hr capsule TAKE 1 CAPSULE BY MOUTH EVERY DAY 90 capsule 0   indapamide (LOZOL) 1.25 MG tablet Take 1 tablet (1.25 mg total) by mouth daily. 90 tablet 0   No facility-administered medications prior to visit.    ROS Review of Systems  Objective:  BP 120/86 (BP Location: Left  Arm, Patient Position: Sitting, Cuff Size: Large)   Pulse 76   Temp 98.3 F (36.8 C) (Oral)   Ht 5\' 6"  (1.676 m)   Wt 209 lb 9.6 oz (95.1 kg)   SpO2 98%   BMI 33.83 kg/m   BP Readings from Last 3 Encounters:  06/04/23 120/86  11/25/22 132/80  05/08/22 (!) 148/100    Wt Readings from Last 3 Encounters:  06/04/23 209 lb 9.6 oz (95.1 kg)  11/25/22 214 lb (97.1 kg)  05/08/22 220 lb (99.8 kg)    Physical Exam  Lab Results  Component Value Date   WBC 6.1 11/25/2022   HGB 17.0 11/25/2022   HCT 51.9 11/25/2022   PLT 163.0 11/25/2022   GLUCOSE 88 11/25/2022   CHOL 206 (H) 05/08/2022   TRIG 161.0 (H) 05/08/2022   HDL 76.60 05/08/2022   LDLCALC 97 05/08/2022   ALT 42 05/08/2022   AST 28 05/08/2022   NA 135 11/25/2022   K 3.8 11/25/2022   CL 98 11/25/2022   CREATININE 0.93 11/25/2022   BUN 11 11/25/2022   CO2 28 11/25/2022   TSH 1.36 05/08/2022   PSA 0.35 05/08/2022   INR 0.9 02/23/2017   HGBA1C 5.0 07/10/2016    No results found.  Assessment & Plan:  Vitamin D deficiency disease -     VITAMIN D 25 Hydroxy (Vit-D Deficiency, Fractures); Future  Hypertriglyceridemia without hypercholesterolemia -     Lipid panel; Future  Left upper  quadrant abdominal pain -     Urinalysis, Routine w reflex microscopic; Future -     Hepatic function panel; Future -     CBC with Differential/Platelet; Future -     Basic metabolic panel; Future -     Lipase; Future  Encounter for routine adult health examination with abnormal findings -     PSA; Future  Primary hypertension -     Urinalysis, Routine w reflex microscopic; Future -     Hepatic function panel; Future -     CBC with Differential/Platelet; Future -     Basic metabolic panel; Future  Carpal tunnel syndrome of right wrist -     Ambulatory referral to Physical Medicine Rehab     Follow-up: Return in about 6 months (around 12/02/2023).  Sanda Linger, MD

## 2023-06-05 ENCOUNTER — Encounter: Payer: Self-pay | Admitting: Internal Medicine

## 2023-06-05 MED ORDER — CHOLECALCIFEROL 50 MCG (2000 UT) PO TABS: 1.0000 | ORAL_TABLET | Freq: Every day | ORAL | 1 refills | Status: AC

## 2023-06-11 ENCOUNTER — Encounter: Payer: Self-pay | Admitting: Internal Medicine

## 2023-06-22 ENCOUNTER — Other Ambulatory Visit: Payer: Self-pay | Admitting: Internal Medicine

## 2023-06-22 DIAGNOSIS — G5601 Carpal tunnel syndrome, right upper limb: Secondary | ICD-10-CM

## 2023-06-22 MED ORDER — TRAMADOL HCL 50 MG PO TABS: 50.0000 mg | ORAL_TABLET | Freq: Three times a day (TID) | ORAL | 0 refills | Status: AC | PRN

## 2023-06-24 ENCOUNTER — Encounter: Payer: Self-pay | Admitting: Physical Medicine & Rehabilitation

## 2023-07-23 ENCOUNTER — Encounter: Attending: Physical Medicine & Rehabilitation | Admitting: Physical Medicine & Rehabilitation

## 2023-07-23 ENCOUNTER — Other Ambulatory Visit: Payer: Self-pay | Admitting: Internal Medicine

## 2023-07-23 ENCOUNTER — Encounter: Payer: Self-pay | Admitting: Physical Medicine & Rehabilitation

## 2023-07-23 VITALS — BP 135/90 | HR 81 | Ht 66.0 in | Wt 210.0 lb

## 2023-07-23 DIAGNOSIS — R202 Paresthesia of skin: Secondary | ICD-10-CM | POA: Diagnosis not present

## 2023-07-23 DIAGNOSIS — R200 Anesthesia of skin: Secondary | ICD-10-CM | POA: Diagnosis not present

## 2023-07-23 DIAGNOSIS — I10 Essential (primary) hypertension: Secondary | ICD-10-CM

## 2023-07-23 DIAGNOSIS — I4891 Unspecified atrial fibrillation: Secondary | ICD-10-CM

## 2023-07-23 DIAGNOSIS — M5412 Radiculopathy, cervical region: Secondary | ICD-10-CM

## 2023-07-23 NOTE — Progress Notes (Signed)
 Subjective:    Patient ID: Mason Maddox, male    DOB: 08-25-1980, 43 y.o.   MRN: [id number]  HPI Right hand tingling and numbness 43 year old male referred by primary care physician for right hand complaints.  He had no discrete injury.  He is not diabetic, no thyroid issues.  No previous neck wrist elbow or hand surgery. 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 General activity 2 Relation with others 2 Enjoyment of life 2 What TIME of day is your pain at its worst? night Sleep (in general) Poor  Pain is worse with: inactivity Pain improves with: therapy/exercise Relief from Meds: 4  walk without assistance ability to climb steps?  yes do you drive?  yes Do you have any goals in this area?  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  yes  numbness tingling  Any changes since last visit?  no  Any changes since last visit?  no    Family History  Problem Relation Age of Onset   Hypertension Mother    Hypertension Father    Hypertension Brother    Alcohol abuse Neg Hx    Cancer Neg Hx    Diabetes Neg Hx    COPD Neg Hx    Depression Neg Hx    Drug abuse Neg Hx    Early death Neg Hx    Heart disease Neg Hx    Hyperlipidemia Neg Hx    Kidney disease Neg Hx    Stroke Neg Hx    Social History   Socioeconomic History   Marital status: Married    Spouse name: Not on file   Number of children: 2   Years of education: 16   Highest education level: Not on file  Occupational History   Occupation: Chartered certified accountant:  [employer]    Comment: [employer]  Tobacco Use   Smoking status: Never   Smokeless tobacco: Never  Substance and Sexual Activity   Alcohol use: Yes    Alcohol/week: 3.0 standard drinks of alcohol    Types: 3 Standard drinks or equivalent per week   Drug use: No   Sexual activity: Yes    Partners: Female    Birth control/protection: Condom  Other Topics Concern   Not on file  Social History Narrative   [employer]- BS. Married- '09. 2 dtrs -'06, '09. 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  Opioid Risk Score:  Fall Risk Score:  `1  Depression screen [facility] 2/9     06/04/2023    1:22 PM 05/08/2022   11:14 AM 11/08/2018    4:49 PM 05/03/2015   12:03 PM  Depression screen PHQ 2/9  Decreased Interest 0 0 2 0  Down, Depressed, Hopeless 0 0 3 0  PHQ - 2 Score 0 0 5 0  Altered sleeping  1 1   Tired, decreased energy  0 1   Change in appetite  0 1   Feeling bad or failure about yourself   0 3   Trouble concentrating  0 0   Moving slowly or fidgety/restless  0 1   Suicidal thoughts  0 0   PHQ-9 Score  1 12   Difficult doing work/chores   Very difficult     Review of Systems  Musculoskeletal:        Right pain with tingling & numbness  All other systems reviewed and are negative.      Objective:   Physical Exam Vitals and nursing note reviewed.  Constitutional:      Appearance: He is obese.  Eyes:     Extraocular Movements:  Extraocular movements intact.     Conjunctiva/sclera: Conjunctivae normal.     Pupils: Pupils are equal, round, and reactive to light.  Musculoskeletal:     Cervical back: Normal range of motion. No rigidity or tenderness.     Comments: No evidence of thenar or hypothenar atrophy Negative reverse Phalen's Positive Tinel's at the right wrist Negative foraminal compression   Skin:    General: Skin is warm and dry.  Neurological:     Mental Status: He is alert.   Sensation is normal to light touch and pinprick in bilateral median and ulnar distributions. Deep tendon reflexes 1+ at the biceps triceps bilaterally. Motor strength is 5/5 bilateral deltoid bicep tricep grip wrist extensors hand intrinsic muscles and finger flexor muscles. No pain with thumb wrist or finger range of motion        Assessment & Plan:    1.  Right hand tingling in thumb index middle and ring finger.  Does not appear to be musculoskeletal, will check EMG/NCV right upper extremity to evaluate for focal compressive neuropathy. Discussed with patient he agrees with plan.  I will see him back for the test

## 2023-07-23 NOTE — Patient Instructions (Signed)
 Pinched Nerve in the Wrist (Carpal Tunnel Syndrome): What to Know  Pinched nerve in the wrist (carpal tunnel syndrome, or CTS) is a nerve problem that causes pain, numbness, and weakness in the wrist, hand, and fingers. The carpal tunnel is a narrow space that is on the palm side of your wrist. Repeated wrist motions or certain diseases may cause swelling in the tunnel. This swelling can pinch the main nerve in the wrist (the median nerve). What are the causes? CTS may be caused by: Moving your hand and wrist over and over again while doing a task. Hurting the wrist. Arthritis. A pocket of fluid (cyst) or a growth (tumor) in the carpal tunnel. Fluid buildup when you are pregnant. Use of tools that vibrate. In some cases, the cause of CTS is not known. What increases the risk? You're more likely to have CTS if: You have a job that makes you do these things: Move your hand firmly over and over again. Work with tools that vibrate, such as drills or sanders. You're male. You have diabetes, obesity, thyroid problems, or kidney failure. What are the signs or symptoms? Symptoms of this condition include: A tingling feeling in your fingers. You may feel this pain in the thumb, index finger, or middle finger. Tingling or loss of feeling in your hand. Pain in your entire arm. This pain may get worse when you bend your wrist and elbow for a long time. Pain in your wrist that goes up your arm to your shoulder. Pain that goes down into your palm or fingers. Weakness in your hands. You may find it hard to grab and hold items. Your symptoms may feel worse during the night. How is this diagnosed? CTS is diagnosed with a medical history and physical exam. Tests and imaging may also be done to: Check the electrical signals sent by your nerves into the muscles. Check how well electrical signals pass through your nerves. Check possible causes of your CTS. These include X-rays, ultrasound, and  MRI. How is this treated? CTS may be treated with: Lifestyle changes. You will be asked to stop or change the activity that caused your problem. Physical therapy. This may include: Exercises that stretch and strengthen the muscles and tendons in the wrist and hand. Nerve gliding or flossing exercises. These help keep nerves moving smoothly through the tissues around them. Occupational therapy. You'll learn how to use your hand again. Medicines for pain and swelling. You may have injections in your wrist. A wrist splint or brace. Surgery. Follow these instructions at home: If you have a splint or brace: Wear the splint or brace as told. Take it off only if your provider says you can. Check the skin around it every day. Tell your provider if you see problems. Loosen the splint or brace if your fingers tingle, are numb, or turn cold and blue. Keep the splint or brace clean and dry. If the splint or brace isn't waterproof: Do not let it get wet. Cover it when you take a bath or shower. Use a cover that doesn't let any water in. Managing pain, stiffness, and swelling  Use ice or an ice pack as told. If you have a splint or brace that you can take off, remove it only as told. Place a towel between your skin and the ice. Leave the ice on for 20 minutes, 2-3 times a day. If your skin turns red, take off the ice right away to prevent skin damage. The risk  of damage is higher if you can't feel pain, heat, or cold. Move your fingers often to reduce stiffness and swelling. General instructions Take your medicines only as told. Rest your wrist and hand from activity that may cause pain. If your CTS is caused by things you do at work, talk with your employer about making changes. For example, you may need a wrist pad to use while typing. Exercise as told. Follow instructions on how to do nerve gliding or flossing exercises. These help keep nerves in moving smoothly through the tissues around  them. Keep all follow-up visits. This is important. Where to find more information American Academy of Orthopedic Surgeons: orthoinfo.aaos.[employer] of Neurological Disorders and Stroke: BasicFM.no Contact a health care provider if: You have new symptoms. Your pain is not controlled with medicines. Your symptoms get worse. Get help right away if: Your hand or wrist tingles or is numb, and the symptoms become very bad. This information is not intended to replace advice given to you by your health care provider. Make sure you discuss any questions you have with your health care provider. Document Revised: 02/10/2023 Document Reviewed: 11/28/2022 Elsevier Patient Education  2024 [employer].

## 2023-09-22 ENCOUNTER — Encounter: Attending: Physical Medicine & Rehabilitation | Admitting: Physical Medicine & Rehabilitation

## 2023-09-22 ENCOUNTER — Encounter: Payer: Self-pay | Admitting: Physical Medicine & Rehabilitation

## 2023-09-22 VITALS — BP 123/82 | HR 92 | Ht 66.0 in | Wt 203.4 lb

## 2023-09-22 DIAGNOSIS — R200 Anesthesia of skin: Secondary | ICD-10-CM | POA: Diagnosis not present

## 2023-09-22 DIAGNOSIS — R202 Paresthesia of skin: Secondary | ICD-10-CM | POA: Insufficient documentation

## 2023-09-22 NOTE — Progress Notes (Signed)
 EMG/NCV of the right upper extremity completed please see full report under media tab.  It may take up to 7 days once the study is complete to be scanned. Briefly this is a 43 year old male with 26-month history of numbness tingling in the thumb index and middle finger.  He was referred by primary care for evaluation.  EMG/NCV demonstrating prolongation of distal latencies of the median motor and median sensory.  No significant loss of amplitude.  Needle exam was negative.  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  Patient instructed to wear resplint at night and return to clinic in approximately 4 weeks if no better we will do carpal tunnel injection

## 2023-09-25 ENCOUNTER — Encounter: Payer: Self-pay | Admitting: Physical Medicine & Rehabilitation

## 2023-10-19 ENCOUNTER — Other Ambulatory Visit: Payer: Self-pay | Admitting: Internal Medicine

## 2023-10-19 DIAGNOSIS — I4891 Unspecified atrial fibrillation: Secondary | ICD-10-CM

## 2023-10-20 ENCOUNTER — Encounter: Admitting: Physical Medicine & Rehabilitation

## 2024-01-16 ENCOUNTER — Other Ambulatory Visit: Payer: Self-pay | Admitting: Internal Medicine

## 2024-01-16 DIAGNOSIS — I4891 Unspecified atrial fibrillation: Secondary | ICD-10-CM

## 2024-02-02 ENCOUNTER — Other Ambulatory Visit: Payer: Self-pay | Admitting: Internal Medicine

## 2024-02-02 DIAGNOSIS — I4891 Unspecified atrial fibrillation: Secondary | ICD-10-CM

## 2024-02-02 NOTE — Telephone Encounter (Unsigned)
 Copied from CRM [id number]. Topic: Clinical - Medication Refill >> Feb 02, 2024  2:19 PM Eva FALCON wrote: Medication: diltiazem  (CARDIZEM  CD) 120 MG 24 hr capsule  Has the patient contacted their pharmacy? Yes (Agent: If no, request that the patient contact the pharmacy for the refill. If patient does not wish to contact the pharmacy document the reason why and proceed with request.) (Agent: If yes, when and what did the pharmacy advise?)  This is the patient's preferred pharmacy:  [id number] [place], [place] - [address] [place] [address] Phone: [phone] Fax: [phone]  Is this the correct pharmacy for this prescription? Yes If no, delete pharmacy and type the correct one.   Has the prescription been filled recently? Yes  Is the patient out of the medication? No  Has the patient been seen for an appointment in the last year OR does the patient have an upcoming appointment? Yes  Can we respond through MyChart? Yes  Agent: Please be advised that Rx refills may take up to 3 business days. We ask that you follow-up with your pharmacy.

## 2024-02-08 ENCOUNTER — Telehealth: Payer: Self-pay

## 2024-02-08 NOTE — Telephone Encounter (Signed)
 Copied from CRM [id number]. Topic: Clinical - Prescription Issue >> Feb 08, 2024  4:20 PM Jasmin G wrote: Reason for CRM: Pt called to check on the status of his diltiazem  (CARDIZEM  CD) 120 MG 24 hr capsule refill, I could not find info to relay. Please call pt back at [phone] to give him a status.

## 2024-02-10 ENCOUNTER — Other Ambulatory Visit: Payer: Self-pay

## 2024-02-10 DIAGNOSIS — I4891 Unspecified atrial fibrillation: Secondary | ICD-10-CM

## 2024-02-10 MED ORDER — DILTIAZEM HCL ER COATED BEADS 120 MG PO CP24: 120.0000 mg | ORAL_CAPSULE | Freq: Every day | ORAL | 0 refills | Status: DC

## 2024-02-10 NOTE — Telephone Encounter (Signed)
 Appointment has been scheduled and enough medication to get him to his appointment has been sent to the pharmacy.

## 2024-02-15 ENCOUNTER — Encounter: Payer: Self-pay | Admitting: Internal Medicine

## 2024-02-15 ENCOUNTER — Ambulatory Visit: Admitting: Internal Medicine

## 2024-02-15 ENCOUNTER — Ambulatory Visit: Attending: Internal Medicine

## 2024-02-15 VITALS — BP 134/96 | HR 79 | Temp 98.9°F | Ht 66.0 in | Wt 208.8 lb

## 2024-02-15 DIAGNOSIS — I4891 Unspecified atrial fibrillation: Secondary | ICD-10-CM | POA: Diagnosis not present

## 2024-02-15 DIAGNOSIS — I10 Essential (primary) hypertension: Secondary | ICD-10-CM | POA: Diagnosis not present

## 2024-02-15 DIAGNOSIS — I4892 Unspecified atrial flutter: Secondary | ICD-10-CM | POA: Diagnosis not present

## 2024-02-15 DIAGNOSIS — E781 Pure hyperglyceridemia: Secondary | ICD-10-CM

## 2024-02-15 LAB — BASIC METABOLIC PANEL WITH GFR
BUN: 12 mg/dL (ref 6–23)
CO2: 26 meq/L (ref 19–32)
Calcium: 9.4 mg/dL (ref 8.4–10.5)
Chloride: 102 meq/L (ref 96–112)
Creatinine, Ser: 0.97 mg/dL (ref 0.40–1.50)
GFR: 95.64 mL/min (ref >60.00)
Glucose, Bld: 88 mg/dL (ref 70–99)
Potassium: 4.2 meq/L (ref 3.5–5.1)
Sodium: 136 meq/L (ref 135–145)

## 2024-02-15 LAB — TSH: TSH: 1.1 u[IU]/mL (ref 0.35–5.50)

## 2024-02-15 LAB — TRIGLYCERIDES: Triglycerides: 74 mg/dL (ref 0.0–149.0)

## 2024-02-15 MED ORDER — DILTIAZEM HCL ER COATED BEADS 120 MG PO CP24: 120.0000 mg | ORAL_CAPSULE | Freq: Every day | ORAL | 1 refills | Status: AC

## 2024-02-15 NOTE — Progress Notes (Addendum)
 Subjective:  Patient ID: Mason Maddox, male    DOB: [birth year]  Age: 43 y.o. MRN: [id number]  CC: Hypertension   HPI LIGE LAKEMAN presents for f/up ---  Discussed the use of AI scribe software for clinical note transcription with the patient, who gave verbal consent to proceed.  History of Present Illness Mason Maddox is a 43 year old male with hypertension and atrial fibrillation who presents for blood pressure management.  His blood pressure has been consistently around 136/88 mmHg when checked at home. He experiences no symptoms such as headache, blurred vision, chest pain, shortness of breath, dizziness, or lightheadedness. He is currently taking a calcium channel blocker for hypertension without noticeable side effects. He sometimes feels tired but does not attribute it to the medication. He has not been prescribed a diuretic before.  He has a history of atrial fibrillation, experiencing irregular heartbeats occasionally but denies any associated dizziness or lightheadedness.  No swelling in his legs or feet and no issues with bowel movements. He has not received flu or COVID vaccines. He lives near [place] and [address] area, close [facility] [address].     Outpatient Medications Prior to Visit  Medication Sig Dispense Refill   Cholecalciferol  50 MCG (2000 UT) TABS Take 1 tablet (2,000 Units total) by mouth daily. 90 tablet 1   diltiazem  (CARDIZEM  CD) 120 MG 24 hr capsule Take 1 capsule (120 mg total) by mouth daily. 7 capsule 0   meloxicam  (MOBIC ) 7.5 MG tablet TAKE 1 TABLET BY MOUTH DAILY AS NEEDED FOR PAIN 90 tablet 0   No facility-administered medications prior to visit.    ROS Review of Systems  Constitutional:  Negative for appetite change, chills, diaphoresis, fatigue and fever.  HENT: Negative.    Eyes: Negative.   Respiratory: Negative.  Negative for cough, chest tightness, shortness of breath and wheezing.   Cardiovascular:  Positive for palpitations.  Negative for chest pain and leg swelling.  Gastrointestinal: Negative.  Negative for abdominal pain, constipation, diarrhea, nausea and vomiting.  Endocrine: Negative.   Genitourinary: Negative.  Negative for difficulty urinating.  Musculoskeletal: Negative.  Negative for arthralgias, joint swelling and myalgias.  Skin: Negative.   Neurological:  Negative for dizziness, tremors and weakness.  Hematological:  Negative for adenopathy. Does not bruise/bleed easily.  Psychiatric/Behavioral: Negative.      Objective:  BP (!) 134/96 (BP Location: Left Arm, Patient Position: Sitting, Cuff Size: Normal)   Pulse 79   Temp 98.9 F (37.2 C) (Oral)   Ht 5' 6 (1.676 m)   Wt 208 lb 12.8 oz (94.7 kg)   SpO2 98%   BMI 33.70 kg/m   BP Readings from Last 3 Encounters:  02/15/24 (!) 134/96  09/22/23 123/82  07/23/23 (!) 135/90    Wt Readings from Last 3 Encounters:  02/15/24 208 lb 12.8 oz (94.7 kg)  09/22/23 203 lb 6.4 oz (92.3 kg)  07/23/23 210 lb (95.3 kg)    Physical Exam Vitals reviewed.  Constitutional:      Appearance: Normal appearance.  HENT:     Nose: Nose normal.     Mouth/Throat:     Mouth: Mucous membranes are moist.  Eyes:     General: No scleral icterus.    Conjunctiva/sclera: Conjunctivae normal.  Cardiovascular:     Rate and Rhythm: Normal rate and regular rhythm.     Heart sounds: No murmur heard.    No friction rub. No gallop.  Pulmonary:  Effort: Pulmonary effort is normal.     Breath sounds: No stridor. No wheezing, rhonchi or rales.  Abdominal:     General: Abdomen is flat.     Palpations: There is no mass.     Tenderness: There is no abdominal tenderness. There is no guarding.     Hernia: No hernia is present.  Musculoskeletal:        General: Normal range of motion.     Cervical back: Neck supple.     Right lower leg: No edema.     Left lower leg: No edema.  Lymphadenopathy:     Cervical: No cervical adenopathy.  Skin:    General: Skin is warm  and dry.  Neurological:     General: No focal deficit present.     Mental Status: He is alert.  Psychiatric:        Mood and Affect: Mood normal.        Behavior: Behavior normal.     Lab Results  Component Value Date   WBC 6.9 06/04/2023   HGB 16.7 06/04/2023   HCT 50.8 06/04/2023   PLT 169.0 06/04/2023   GLUCOSE 80 06/04/2023   CHOL 159 06/04/2023   TRIG 151.0 (H) 06/04/2023   HDL 54.30 06/04/2023   LDLCALC 74 06/04/2023   ALT 24 06/04/2023   AST 19 06/04/2023   NA 139 06/04/2023   K 4.3 06/04/2023   CL 100 06/04/2023   CREATININE 1.07 06/04/2023   BUN 10 06/04/2023   CO2 32 06/04/2023   TSH 1.36 05/08/2022   PSA 0.33 06/04/2023   INR 0.9 02/23/2017   HGBA1C 5.0 07/10/2016    No results found.  Assessment & Plan:  Primary hypertension -     Basic metabolic panel with GFR; Future -     TSH; Future  Atrial fibrillation and flutter (HCC) -     dilTIAZem  HCl ER Coated Beads; Take 1 capsule (120 mg total) by mouth daily.  Dispense: 90 capsule; Refill: 1 -     TSH; Future -     LONG TERM MONITOR (3-14 DAYS); Future  Hypertriglyceridemia without hypercholesterolemia -     Triglycerides; Future     Follow-up: Return in about 6 months (around 08/14/2024).  Debby Molt, MD

## 2024-02-15 NOTE — Progress Notes (Unsigned)
 EP to read.

## 2024-02-15 NOTE — Patient Instructions (Signed)
 Hypertension, Adult High blood pressure (hypertension) is when the force of blood pumping through the arteries is too strong. The arteries are the blood vessels that carry blood from the heart throughout the body. Hypertension forces the heart to work harder to pump blood and may cause arteries to become narrow or stiff. Untreated or uncontrolled hypertension can lead to a heart attack, heart failure, a stroke, kidney disease, and other problems. A blood pressure reading consists of a higher number over a lower number. Ideally, your blood pressure should be below 120/80. The first ("top") number is called the systolic pressure. It is a measure of the pressure in your arteries as your heart beats. The second ("bottom") number is called the diastolic pressure. It is a measure of the pressure in your arteries as the heart relaxes. What are the causes? The exact cause of this condition is not known. There are some conditions that result in high blood pressure. What increases the risk? Certain factors may make you more likely to develop high blood pressure. Some of these risk factors are under your control, including: Smoking. Not getting enough exercise or physical activity. Being overweight. Having too much fat, sugar, calories, or salt (sodium) in your diet. Drinking too much alcohol. Other risk factors include: Having a personal history of heart disease, diabetes, high cholesterol, or kidney disease. Stress. Having a family history of high blood pressure and high cholesterol. Having obstructive sleep apnea. Age. The risk increases with age. What are the signs or symptoms? High blood pressure may not cause symptoms. Very high blood pressure (hypertensive crisis) may cause: Headache. Fast or irregular heartbeats (palpitations). Shortness of breath. Nosebleed. Nausea and vomiting. Vision changes. Severe chest pain, dizziness, and seizures. How is this diagnosed? This condition is diagnosed by  measuring your blood pressure while you are seated, with your arm resting on a flat surface, your legs uncrossed, and your feet flat on the floor. The cuff of the blood pressure monitor will be placed directly against the skin of your upper arm at the level of your heart. Blood pressure should be measured at least twice using the same arm. Certain conditions can cause a difference in blood pressure between your right and left arms. If you have a high blood pressure reading during one visit or you have normal blood pressure with other risk factors, you may be asked to: Return on a different day to have your blood pressure checked again. Monitor your blood pressure at home for 1 week or longer. If you are diagnosed with hypertension, you may have other blood or imaging tests to help your health care provider understand your overall risk for other conditions. How is this treated? This condition is treated by making healthy lifestyle changes, such as eating healthy foods, exercising more, and reducing your alcohol intake. You may be referred for counseling on a healthy diet and physical activity. Your health care provider may prescribe medicine if lifestyle changes are not enough to get your blood pressure under control and if: Your systolic blood pressure is above 130. Your diastolic blood pressure is above 80. Your personal target blood pressure may vary depending on your medical conditions, your age, and other factors. Follow these instructions at home: Eating and drinking  Eat a diet that is high in fiber and potassium, and low in sodium, added sugar, and fat. An example of this eating plan is called the DASH diet. DASH stands for Dietary Approaches to Stop Hypertension. To eat this way: Eat  plenty of fresh fruits and vegetables. Try to fill one half of your plate at each meal with fruits and vegetables. Eat whole grains, such as whole-wheat pasta, brown rice, or whole-grain bread. Fill about one  fourth of your plate with whole grains. Eat or drink low-fat dairy products, such as skim milk or low-fat yogurt. Avoid fatty cuts of meat, processed or cured meats, and poultry with skin. Fill about one fourth of your plate with lean proteins, such as fish, chicken without skin, beans, eggs, or tofu. Avoid pre-made and processed foods. These tend to be higher in sodium, added sugar, and fat. Reduce your daily sodium intake. Many people with hypertension should eat less than 1,500 mg of sodium a day. Do not drink alcohol if: Your health care provider tells you not to drink. You are pregnant, may be pregnant, or are planning to become pregnant. If you drink alcohol: Limit how much you have to: 0-1 drink a day for women. 0-2 drinks a day for men. Know how much alcohol is in your drink. In the [place] one drink equals one 12 oz bottle of beer (355 mL), one 5 oz glass of wine (148 mL), or one 1 oz glass of hard liquor (44 mL). Lifestyle  Work with your health care provider to maintain a healthy body weight or to lose weight. Ask what an ideal weight is for you. Get at least 30 minutes of exercise that causes your heart to beat faster (aerobic exercise) most days of the week. Activities may include walking, swimming, or biking. Include exercise to strengthen your muscles (resistance exercise), such as Pilates or lifting weights, as part of your weekly exercise routine. Try to do these types of exercises for 30 minutes at least 3 days a week. Do not use any products that contain nicotine or tobacco. These products include cigarettes, chewing tobacco, and vaping devices, such as e-cigarettes. If you need help quitting, ask your health care provider. Monitor your blood pressure at home as told by your health care provider. Keep all follow-up visits. This is important. Medicines Take over-the-counter and prescription medicines only as told by your health care provider. Follow directions carefully. Blood  pressure medicines must be taken as prescribed. Do not skip doses of blood pressure medicine. Doing this puts you at risk for problems and can make the medicine less effective. Ask your health care provider about side effects or reactions to medicines that you should watch for. Contact a health care provider if you: Think you are having a reaction to a medicine you are taking. Have headaches that keep coming back (recurring). Feel dizzy. Have swelling in your ankles. Have trouble with your vision. Get help right away if you: Develop a severe headache or confusion. Have unusual weakness or numbness. Feel faint. Have severe pain in your chest or abdomen. Vomit repeatedly. Have trouble breathing. These symptoms may be an emergency. Get help right away. Call 911. Do not wait to see if the symptoms will go away. Do not drive yourself to the hospital. Summary Hypertension is when the force of blood pumping through your arteries is too strong. If this condition is not controlled, it may put you at risk for serious complications. Your personal target blood pressure may vary depending on your medical conditions, your age, and other factors. For most people, a normal blood pressure is less than 120/80. Hypertension is treated with lifestyle changes, medicines, or a combination of both. Lifestyle changes include losing weight, eating a healthy,  low-sodium diet, exercising more, and limiting alcohol. This information is not intended to replace advice given to you by your health care provider. Make sure you discuss any questions you have with your health care provider. Document Revised: 02/05/2021 Document Reviewed: 02/05/2021 [employer] Patient Education  2024 [employer].

## 2024-02-16 ENCOUNTER — Ambulatory Visit: Payer: Self-pay | Admitting: Internal Medicine

## 2024-02-16 MED ORDER — INDAPAMIDE 1.25 MG PO TABS: 1.2500 mg | ORAL_TABLET | Freq: Every day | ORAL | 1 refills | Status: AC
